# Patient Record
Sex: Female | Born: 1975 | Race: Black or African American | Hispanic: No | Marital: Married | Smoking: Former smoker
Health system: Southern US, Community
[De-identification: ages and names within clinical notes are randomized; demographics above are authoritative.]

## PROBLEM LIST (undated history)

## (undated) DIAGNOSIS — K219 Gastro-esophageal reflux disease without esophagitis: Secondary | ICD-10-CM

## (undated) HISTORY — PX: ECTOPIC PREGNANCY SURGERY: SHX613

## (undated) HISTORY — PX: ESOPHAGEAL DILATION: SHX303

## (undated) HISTORY — DX: Gastro-esophageal reflux disease without esophagitis: K21.9

---

## 2005-11-27 ENCOUNTER — Emergency Department (HOSPITAL_COMMUNITY): Admission: EM | Admit: 2005-11-27 | Discharge: 2005-11-27 | Payer: Self-pay | Admitting: Emergency Medicine

## 2007-10-10 ENCOUNTER — Emergency Department (HOSPITAL_COMMUNITY): Admission: EM | Admit: 2007-10-10 | Discharge: 2007-10-10 | Payer: Self-pay | Admitting: Emergency Medicine

## 2009-02-25 ENCOUNTER — Emergency Department (HOSPITAL_COMMUNITY): Admission: EM | Admit: 2009-02-25 | Discharge: 2009-02-26 | Payer: Self-pay | Admitting: Emergency Medicine

## 2009-07-28 ENCOUNTER — Emergency Department (HOSPITAL_COMMUNITY): Admission: EM | Admit: 2009-07-28 | Discharge: 2009-07-28 | Payer: Self-pay | Admitting: Emergency Medicine

## 2010-09-03 ENCOUNTER — Emergency Department: Payer: Self-pay | Admitting: Emergency Medicine

## 2010-11-03 ENCOUNTER — Ambulatory Visit: Payer: Self-pay | Admitting: Gastroenterology

## 2010-11-03 DIAGNOSIS — R1310 Dysphagia, unspecified: Secondary | ICD-10-CM | POA: Insufficient documentation

## 2010-11-04 ENCOUNTER — Telehealth: Payer: Self-pay | Admitting: Gastroenterology

## 2010-11-06 ENCOUNTER — Ambulatory Visit: Payer: Self-pay | Admitting: Gastroenterology

## 2010-11-07 ENCOUNTER — Telehealth: Payer: Self-pay | Admitting: Gastroenterology

## 2011-01-01 ENCOUNTER — Ambulatory Visit: Admit: 2011-01-01 | Payer: Self-pay | Admitting: Gastroenterology

## 2011-01-27 NOTE — Progress Notes (Signed)
Summary: Her insurance will not cover meds  Phone Note Call from Patient Call back at Home Phone [phone]   Call For: Dr Arlyce Dice Summary of Call: Her insurance will not cover the medication that was ordered for her.  Initial call taken by: Leanor Kail [facility],  November 07, 2010 1:12 PM  Follow-up for Phone Call        Tried to contact pt, No Answer.  Follow-up by: Merri Ray CMA [facility]),  November 10, 2010 9:28 AM  Additional Follow-up for Phone Call Additional follow up Details #1::        Insurance (Medicaid)Approved Protonix for 1 year. Tried to contact pt no answer. Additional Follow-up by: Merri Ray CMA [facility]),  November 10, 2010 10:07 AM

## 2011-01-27 NOTE — Letter (Signed)
Summary: Results Letter  [facility]  [address] [place], [place] [address]   Phone: [phone]  Fax: [phone]        November 03, 2010 MRN: [id number]    Crystal Salazar [address] [place], [place]  [address]    Dear Ms. [facility],  It is my pleasure to have treated you recently as a new patient in my office. I appreciate your confidence and the opportunity to participate in your care.  Since I do have a busy inpatient endoscopy schedule and office schedule, my office hours vary weekly. I am, however, available for emergency calls everyday through my office. If I am not available for an urgent office appointment, another one of our gastroenterologist will be able to assist you.  My well-trained staff are prepared to help you at all times. For emergencies after office hours, a physician from our Gastroenterology section is always available through my 24 hour answering service  Once again I welcome you as a new patient and I look forward to a happy and healthy relationship             Sincerely,  Louis Meckel MD  This letter has been electronically signed by your physician.  Appended Document: Results Letter letter mailed

## 2011-01-27 NOTE — Assessment & Plan Note (Signed)
Summary: GERD/YF   History of Present Illness Visit Type: consult Primary GI MD: Melvia Heaps MD [facility] Primary Provider: Clyda Greener, MD Requesting Provider: Clyda Greener, MD Chief Complaint: solid food dysphagia History of Present Illness:   Ms. Crystal Salazar is a pleasant 35 year old female referred at the request of Dr. Bruna Potter for complaints of dysphagia.  She complains of dysphagia to solids.  She takes omeprazole twice a day.  Weight has been stable.  She may develop mild chest discomfort with her dysphagia.   GI Review of Systems    Reports chest pain and  dysphagia with solids.        Preventive Screening-Counseling & Management  Alcohol-Tobacco     Smoking Status: current      Drug Use:  no.      Current Medications (verified): 1)  Omeprazole 20 Mg Cpdr (Omeprazole) .... Take 1 Capsule By Mouth Two Times A Day 2)  Mirena 20 Mcg/24hr Iud (Levonorgestrel) .... As Directed 3)  Aleve 220 Mg Tabs (Naproxen Sodium) .... As Needed For Headache  Allergies (verified): 1)  Sulfa  Past History:  Past Medical History: GERD  Past Surgical History: none  Family History: Family History of Colon Cancer: PGF-deceased 45's Family History of Diabetes: Father Family History of Heart Disease: MGM-deceased MI, Mother Family History of Leukemia: PGM  Social History: Married, 2 boys, 2 girls Patient currently smokes.  Alcohol Use - no Daily Caffeine Use 3/day Illicit Drug Use - no Smoking Status:  current Drug Use:  no  Review of Systems       The patient complains of back pain, menstrual pain, and urine leakage.  The patient denies allergy/sinus, anemia, anxiety-new, arthritis/joint pain, blood in urine, breast changes/lumps, confusion, cough, coughing up blood, depression-new, fainting, fatigue, fever, headaches-new, hearing problems, heart murmur, heart rhythm changes, itching, muscle pains/cramps, night sweats, nosebleeds, pregnancy symptoms, shortness of breath, skin  rash, sleeping problems, sore throat, swelling of feet/legs, swollen lymph glands, thirst - excessive, urination - excessive, urination changes/pain, vision changes, and voice change.         All other systems were reviewed and were negative   Vital Signs:  Patient profile:   35 year old female Height:      62 inches Weight:      196 pounds BMI:     35.98 Pulse rate:   64 / minute Pulse rhythm:   regular BP sitting:   108 / 66  (left arm) Cuff size:   regular  Vitals Entered By: Francee Piccolo CMA [facility]) (November 03, 2010 8:27 AM)  Physical Exam  Additional Exam:  On physical exam she is well-developed well-nourished female  skin: anicteric HEENT: normocephalic; PEERLA; no nasal or pharyngeal abnormalities neck: supple nodes: no cervical lymphadenopathy chest: clear to ausculatation and percussion heart: no murmurs, gallops, or rubs abd: soft, nontender; BS normoactive; no abdominal masses, tenderness, organomegaly rectal: deferred ext: no cynanosis, clubbing, edema skeletal: no deformities neuro: oriented x 3; no focal abnormalities    Impression & Recommendations:  Problem # 1:  DYSPHAGIA UNSPECIFIED (ICD-787.20) Assessment New  This is most likely due to an esophageal stricture.  Eosinophilic esophagitis is another consideration.  Recommendations #1 trial of DEXILANT 60 mg daily #2 upper endoscopy with dilatation as indicated  Risks, alternatives, and complications of the procedure, including bleeding, perforation, and possible need for surgery, were explained to the patient.  Patient's questions were answered.  Orders: EGD (EGD)  Patient Instructions: 1)  Copy sent to : Britt Bottom  Blount, MD 2)  Your EGD is scheduled on 11/06/2010 at 3:30pm 3)  We are giving you Dexilant samples today 4)  Conscious Sedation brochure given.  5)  Upper Endoscopy with Dilatation brochure given.  6)  The medication list was reviewed and reconciled.  All changed / newly  prescribed medications were explained.  A complete medication list was provided to the patient / caregiver. Prescriptions: DEXILANT 60 MG CPDR (DEXLANSOPRAZOLE) take 1 tab before breakfast once daily  #30 x 1   Entered and Authorized by:   Louis Meckel MD   Signed by:   Louis Meckel MD on 11/04/2010   Method used:   [facility] to        [facility]  [address] (retail)       [address]       [place], [place]  [address]       Ph: [phone] or [phone]       Fax: [phone]   RxID:   [id number]

## 2011-01-27 NOTE — Miscellaneous (Signed)
  Clinical Lists Changes  Medications: Removed medication of DEXILANT 60 MG CPDR (DEXLANSOPRAZOLE) take 1 tab before breakfast once daily Removed medication of OMEPRAZOLE 20 MG CPDR (OMEPRAZOLE) Take 1 capsule by mouth two times a day Added new medication of PROTONIX 40 MG  TBEC (PANTOPRAZOLE SODIUM) 1 each day 30 minutes before meal - Signed Rx of PROTONIX 40 MG  TBEC (PANTOPRAZOLE SODIUM) 1 each day 30 minutes before meal;  #30 x 2;  Signed;  Entered by: Louis Meckel MD;  Authorized by: Louis Meckel MD;  Method used: [facility]  [address], [place], [place]  [address], Ph: [phone] or [phone], Fax: [phone]    Prescriptions: PROTONIX 40 MG  TBEC (PANTOPRAZOLE SODIUM) 1 each day 30 minutes before meal  #30 x 2   Entered and Authorized by:   Louis Meckel MD   Signed by:   Louis Meckel MD on 11/06/2010   Method used:   [facility]        [facility]  [address] (retail)       [address]       [place], [place]  [address]       Ph: [phone] or [phone]       Fax: [phone]   RxID:   [id number]

## 2011-01-27 NOTE — Letter (Signed)
Summary: EGD Instructions  [facility]  [address] [place], [place] [address]   Phone: [phone]  Fax: [phone]       Crystal Salazar    [birth year]    MRN: [id number]       Procedure Day /Date:THURSDAY 11/06/2010     Arrival Time: 2:30PM     Procedure Time:3:30PM     Location of Procedure:                    X  [facility] (4th Floor)   PREPARATION FOR ENDOSCOPY/DIL   On11/09/2010 THE DAY OF THE PROCEDURE:  1.   No solid foods, milk or milk products are allowed after midnight the night before your procedure.  2.   Do not drink anything colored red or purple.  Avoid juices with pulp.  No orange juice.  3.  You may drink clear liquids until1:30PM, which is 2 hours before your procedure.                                                                                                CLEAR LIQUIDS INCLUDE: Water Jello Ice Popsicles Tea (sugar ok, no milk/cream) Powdered fruit flavored drinks Coffee (sugar ok, no milk/cream) Gatorade Juice: apple, white grape, white cranberry  Lemonade Clear bullion, consomm, broth Carbonated beverages (any kind) Strained chicken noodle soup Hard Candy   MEDICATION INSTRUCTIONS  Unless otherwise instructed, you should take regular prescription medications with a small sip of water as early as possible the morning of your procedure.          OTHER INSTRUCTIONS  You will need a responsible adult at least 37 years of age to accompany you and drive you home.   This person must remain in the waiting room during your procedure.  Wear loose fitting clothing that is easily removed.  Leave jewelry and other valuables at home.  However, you may wish to bring a book to read or an iPod/MP3 player to listen to music as you wait for your procedure to start.  Remove all body piercing jewelry and leave at home.  Total time from sign-in until discharge is approximately 2-3 hours.  You should go home  directly after your procedure and rest.  You can resume normal activities the day after your procedure.  The day of your procedure you should not:   Drive   Make legal decisions   Operate machinery   Drink alcohol   Return to work  You will receive specific instructions about eating, activities and medications before you leave.    The above instructions have been reviewed and explained to me by   _______________________    I fully understand and can verbalize these instructions _____________________________ Date _________

## 2011-01-27 NOTE — Progress Notes (Signed)
Summary: Dexilant denied by [facility]  Phone Note Outgoing Call Call back at [facility] Phone [phone]   Call placed by: Merri Ray CMA [facility]),  November 04, 2010 1:25 PM Summary of Call: Called pt to inform that Dexilant not covered by Medicaid. L/M for pt to return my call Initial call taken by: Merri Ray CMA [facility]),  November 04, 2010 1:25 PM  Follow-up for Phone Call        Pt is having procedure today, she will discuss with Dr Arlyce Dice what she can be prescribed besides the Dexilant that the insurance will cover. Follow-up by: Merri Ray CMA [facility]),  November 06, 2010 2:24 PM

## 2011-01-27 NOTE — Procedures (Signed)
Summary: Upper Endoscopy  Patient: Crystal Salazar Note: All result statuses are Final unless otherwise noted.  Tests: (1) Upper Endoscopy (EGD)   EGD Upper Endoscopy       DONE     [facility]     [address]     [place], [place]  [address]           ENDOSCOPY PROCEDURE REPORT           PATIENT:  Crystal Salazar, Crystal Salazar  MR#:  [id number]     BIRTHDATE:  Jan 03, 1976, 34 yrs. old  GENDER:  female           ENDOSCOPIST:  Barbette Hair. Arlyce Dice, MD     Referred by:           PROCEDURE DATE:  11/06/2010     PROCEDURE:  EGD, diagnostic 43235, Maloney Dilation of Esophagus     ASA CLASS:  Class I     INDICATIONS:  dysphagia           MEDICATIONS:   Fentanyl 50 mcg IV, Versed 6 mg IV, glycopyrrolate     (Robinal) 0.2 mg IV, 0.6cc simethancone 0.6 cc PO     TOPICAL ANESTHETIC:  Exactacain Spray           DESCRIPTION OF PROCEDURE:   After the risks benefits and     alternatives of the procedure were thoroughly explained, informed     consent was obtained.  [facility] GIF-[id number] [id number] endoscope was     introduced through the mouth and advanced to the third portion of     the duodenum, without limitations.  The instrument was slowly     withdrawn as the mucosa was fully examined.     <<PROCEDUREIMAGES>>           A stricture was found at the gastroesophageal junction (see     image2). Early stricture Dilation with maloney dilator 18mm Mild     resistance; no heme  Otherwise the examination was normal.     Retroflexed views revealed no abnormalities.    The scope was then     withdrawn from the patient and the procedure completed.           COMPLICATIONS:  None           ENDOSCOPIC IMPRESSION:     1) Stricture at the gastroesophageal junction - s/p maloney     dilitation     2) Otherwise normal examination     RECOMMENDATIONS:     1) Protonix 40 mg qd     2) Call office next 2-3 days to schedule an office appointment     for 6 weeks     3) repeat dilatation as needed           REPEAT  EXAM:  No           ______________________________     Barbette Hair. Arlyce Dice, MD           CC:  Crystal Greener, MD           n.     Rosalie Doctor:   Barbette Hair. Kaplan at 11/06/2010 03:52 PM           Crystal Salazar, Riccobono Salazar, [id number]  Note: An exclamation mark (!) indicates a result that was not dispersed into the flowsheet. Document Creation Date: 11/06/2010 3:52 PM _______________________________________________________________________  (1) Order result status: Final Collection or observation date-time: 11/06/2010 15:45 Requested date-time:  Receipt date-time:  Reported date-time:  Referring Physician:   Ordering Physician: Melvia Heaps [phone]) Specimen Source:  Source: Launa Grill Order Number: [id number] Lab site:

## 2011-02-04 ENCOUNTER — Ambulatory Visit (INDEPENDENT_AMBULATORY_CARE_PROVIDER_SITE_OTHER): Payer: Medicaid Other | Admitting: Gastroenterology

## 2011-02-04 ENCOUNTER — Encounter: Payer: Self-pay | Admitting: Gastroenterology

## 2011-02-04 DIAGNOSIS — K222 Esophageal obstruction: Secondary | ICD-10-CM

## 2011-02-12 NOTE — Assessment & Plan Note (Signed)
Summary: F/U Procedure   History of Present Illness Visit Type: Follow-up Visit Primary GI MD: Melvia Heaps MD [facility] Primary Provider: Clyda Greener, MD Requesting Provider: N/A Chief Complaint: Post procedure, patient not having any problems History of Present Illness:   Ms. Posa has returned for followup of her esophageal stricture and reflux.  In November, 2011 she was dilated to 18 mm.  Since that time she has had no recurrent GI complaints including dysphagia or pyrosis.  She remains on daily Protonix.   GI Review of Systems      Denies abdominal pain, acid reflux, belching, bloating, chest pain, dysphagia with liquids, dysphagia with solids, heartburn, loss of appetite, nausea, vomiting, vomiting blood, weight loss, and  weight gain.        Denies anal fissure, black tarry stools, change in bowel habit, constipation, diarrhea, diverticulosis, fecal incontinence, heme positive stool, hemorrhoids, irritable bowel syndrome, jaundice, light color stool, liver problems, rectal bleeding, and  rectal pain.    Current Medications (verified): 1)  Mirena 20 Mcg/24hr Iud (Levonorgestrel) .... As Directed 2)  Aleve 220 Mg Tabs (Naproxen Sodium) .... As Needed For Headache 3)  Protonix 40 Mg  Tbec (Pantoprazole Sodium) .Marland Kitchen.. 1 Each Day 30 Minutes Before Meal  Allergies (verified): 1)  Sulfa  Past History:  Past Medical History: Reviewed history from 11/03/2010 and no changes required. GERD  Past Surgical History: Reviewed history from 11/03/2010 and no changes required. none  Family History: Reviewed history from 11/03/2010 and no changes required. Family History of Colon Cancer: PGF-deceased 18's Family History of Diabetes: Father Family History of Heart Disease: MGM-deceased MI, Mother Family History of Leukemia: PGM  Social History: Reviewed history from 11/03/2010 and no changes required. Married, 2 boys, 2 girls Patient currently smokes.  Alcohol Use - no Daily  Caffeine Use 3/day Illicit Drug Use - no  Review of Systems  The patient denies allergy/sinus, anemia, anxiety-new, arthritis/joint pain, back pain, blood in urine, breast changes/lumps, change in vision, confusion, cough, coughing up blood, depression-new, fainting, fatigue, fever, headaches-new, hearing problems, heart murmur, heart rhythm changes, itching, menstrual pain, muscle pains/cramps, night sweats, nosebleeds, pregnancy symptoms, shortness of breath, skin rash, sleeping problems, sore throat, swelling of feet/legs, swollen lymph glands, thirst - excessive , urination - excessive , urination changes/pain, urine leakage, vision changes, and voice change.    Vital Signs:  Patient profile:   35 year old female Height:      62 inches Weight:      195 pounds BMI:     35.79 Pulse rate:   64 / minute Pulse rhythm:   regular BP sitting:   96 / 66  (left arm) Cuff size:   regular  Vitals Entered By: June McMurray CMA [facility] Dull) (February 04, 2011 1:48 PM)   Impression & Recommendations:  Problem # 1:  DYSPHAGIA UNSPECIFIED (ICD-787.20) Assessment Improved Plan repeat dilatation p.r.n.  She will try to wean from Protonix.  Patient Instructions: 1)  Copy sent to : Clyda Greener, MD 2)  Follow up as needed  3)  The medication list was reviewed and reconciled.  All changed / newly prescribed medications were explained.  A complete medication list was provided to the patient / caregiver.

## 2011-04-04 LAB — URINALYSIS, ROUTINE W REFLEX MICROSCOPIC
Bilirubin Urine: NEGATIVE
Ketones, ur: NEGATIVE mg/dL
Specific Gravity, Urine: 1.018 (ref 1.005–1.030)
pH: 7 (ref 5.0–8.0)

## 2011-04-04 LAB — URINE MICROSCOPIC-ADD ON

## 2011-04-09 LAB — URINALYSIS, ROUTINE W REFLEX MICROSCOPIC
Leukocytes, UA: NEGATIVE
Nitrite: NEGATIVE
Protein, ur: 30 mg/dL — AB
Urobilinogen, UA: 1 mg/dL (ref 0.0–1.0)

## 2011-04-09 LAB — URINE MICROSCOPIC-ADD ON

## 2011-04-09 LAB — POCT PREGNANCY, URINE: Preg Test, Ur: NEGATIVE

## 2011-09-04 ENCOUNTER — Emergency Department: Payer: Self-pay

## 2011-10-08 LAB — POCT PREGNANCY, URINE
Operator id: 146091
Preg Test, Ur: POSITIVE

## 2011-10-24 ENCOUNTER — Emergency Department: Payer: Self-pay | Admitting: Internal Medicine

## 2014-03-17 ENCOUNTER — Encounter: Payer: Self-pay | Admitting: *Deleted

## 2015-04-08 ENCOUNTER — Ambulatory Visit: Payer: Medicaid Other | Attending: Orthopaedic Surgery | Admitting: Physical Therapy

## 2015-04-08 ENCOUNTER — Encounter: Payer: Self-pay | Admitting: Physical Therapy

## 2015-04-08 DIAGNOSIS — M5442 Lumbago with sciatica, left side: Secondary | ICD-10-CM | POA: Insufficient documentation

## 2015-04-08 NOTE — Therapy (Signed)
[facility] [address] [place], [place], [address] Phone: [phone]   Fax:  [phone]  Physical Therapy Evaluation  Patient Details  Name: Crystal Salazar MRN: [id number] Date of Birth: 10/30/1976 Referring Provider:  Tarry Kos, MD  Encounter Date: 04/08/2015      PT End of Session - 04/08/15 0948    Visit Number 1   Date for PT Re-Evaluation 06/08/15   PT Start Time 0908   PT Stop Time 1015   PT Time Calculation (min) 67 min      History reviewed. No pertinent past medical history.  History reviewed. No pertinent past surgical history.  There were no vitals filed for this visit.  Visit Diagnosis:  Midline low back pain with left-sided sciatica - Plan: PT plan of care cert/re-cert      Subjective Assessment - 04/08/15 0922    Subjective C/O LBP since epidural in 1997.  She reports that the pain is worse recently.  She works as a Lawyer.   Pertinent History none   Limitations Sitting;Lifting;Standing;Walking;House hold activities   How long can you sit comfortably? 25   How long can you stand comfortably? 10   How long can you walk comfortably? 15   Diagnostic tests x-rays negative   Patient Stated Goals no pain   Currently in Pain? Yes   Pain Score 8    Pain Location Back   Pain Orientation Lower;Left   Pain Descriptors / Indicators Aching;Shooting   Pain Radiating Towards at times down the left posterior leg   Pain Onset More than a month ago   Aggravating Factors  lifting and bending up to 9/10   Pain Relieving Factors rest   Effect of Pain on Daily Activities limits all activities   Multiple Pain Sites Yes   Pain Location Wrist            [facility] PT Assessment - 04/08/15 0001    Assessment   Medical Diagnosis LBP   Onset Date 04/07/14   Prior Therapy none   Precautions   Precautions None   Balance Screen   Has the patient fallen in the past 6 months No   Has the patient had a  decrease in activity level because of a fear of falling?  No   Is the patient reluctant to leave their home because of a fear of falling?  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   Leisure none   Posture/Postural Control   Posture Comments fwd head, rounded shoulders   AROM   Overall AROM Comments Lumbar ROM was decreased 25% with some c/o popping and tightness   Flexibility   Soft Tissue Assessment /Muscle Length --  She is tight in the HS, ITB and piriformis mms   Palpation   Palpation she is very tight in the lumbar paraspinals and into the thoracic area   Special Tests    Special Tests Lumbar   Slump test   Findings Positive   Side Left   Comment bilateral positive   Straight Leg Raise   Findings Positive   Comment bilateral  [facility] Adult PT Treatment/Exercise - 04/08/15 0001    Modalities   Modalities Electrical Stimulation;Moist Heat   Moist Heat Therapy   Number Minutes Moist Heat 15 Minutes   Moist Heat Location --  low back   Electrical Stimulation   Electrical Stimulation Location low back   Electrical Stimulation Parameters IFC   Electrical Stimulation Goals Pain                PT Education - 04/08/15 0946    Education provided Yes   Education Details Wms Flexion for lumbar flexibility   Person(s) Educated Patient   Methods Explanation;Demonstration;Handout   Comprehension Verbalized understanding          PT Short Term Goals - 04/08/15 0951    PT SHORT TERM GOAL #1   Title independent with initial HEP   Time 2   Period Weeks   Status New           PT Long Term Goals - 04/08/15 16100952    PT LONG TERM GOAL #1   Title understand proper posture and body mechanics   Time 2   Period  Weeks   Status New               Plan - 04/08/15 0949    Pt will benefit from skilled therapeutic intervention in order to improve on the following deficits Decreased range of motion;Decreased strength;Increased muscle spasms;Impaired flexibility;Pain;Improper body mechanics   Rehab Potential Good   PT Frequency 2x / week   PT Duration 8 weeks   PT Treatment/Interventions Electrical Stimulation;ADLs/Self Care Home Management;Moist Heat;Ultrasound;Traction;Therapeutic activities;Therapeutic exercise;Manual techniques;Patient/family education   PT Next Visit Plan Add gym exercises   Consulted and Agree with Plan of Care Patient      Medicaid only allows 1 visit per year.  We will D/C   Problem List Patient Active Problem List   Diagnosis Date Noted  . DYSPHAGIA UNSPECIFIED 11/03/2010    Jearld LeschALBRIGHT,Zaevion Parke W, PT 04/08/2015, 10:14 AM  [facility] [address] [facility] [address] [address] [place], [place], [address] Phone: [phone]   Fax:  [phone]

## 2015-06-03 ENCOUNTER — Other Ambulatory Visit: Payer: Self-pay | Admitting: Orthopaedic Surgery

## 2015-06-03 DIAGNOSIS — M545 Low back pain: Secondary | ICD-10-CM

## 2015-06-08 ENCOUNTER — Other Ambulatory Visit: Payer: Medicaid Other

## 2015-06-09 ENCOUNTER — Ambulatory Visit
Admission: RE | Admit: 2015-06-09 | Discharge: 2015-06-09 | Disposition: A | Discharge status: Home / Self Care | Payer: Medicaid Other | Source: Ambulatory Visit | Attending: Orthopaedic Surgery | Admitting: Orthopaedic Surgery

## 2015-06-09 DIAGNOSIS — M545 Low back pain: Secondary | ICD-10-CM

## 2015-06-21 ENCOUNTER — Encounter: Payer: Self-pay | Admitting: Gastroenterology

## 2016-02-04 ENCOUNTER — Ambulatory Visit (INDEPENDENT_AMBULATORY_CARE_PROVIDER_SITE_OTHER): Payer: Medicaid Other | Admitting: Internal Medicine

## 2016-02-04 ENCOUNTER — Encounter: Payer: Self-pay | Admitting: Internal Medicine

## 2016-02-04 VITALS — BP 116/47 | HR 59 | Temp 98.1°F | Wt 201.0 lb

## 2016-02-04 DIAGNOSIS — R1310 Dysphagia, unspecified: Secondary | ICD-10-CM | POA: Diagnosis not present

## 2016-02-04 DIAGNOSIS — Z0000 Encounter for general adult medical examination without abnormal findings: Secondary | ICD-10-CM | POA: Insufficient documentation

## 2016-02-04 DIAGNOSIS — N39498 Other specified urinary incontinence: Secondary | ICD-10-CM

## 2016-02-04 DIAGNOSIS — N3946 Mixed incontinence: Secondary | ICD-10-CM

## 2016-02-04 DIAGNOSIS — R32 Unspecified urinary incontinence: Secondary | ICD-10-CM | POA: Diagnosis not present

## 2016-02-04 NOTE — Patient Instructions (Signed)
We will be sending you to a bladder doctor, to find out why you are having problems with urination.   It is very important you do kegel exercises to help you hold urine and strengthen the muscles in your pelvis.   We will get records from your PCP to find out when you will be due for your next test.  Kegel Exercises The goal of Kegel exercises is to isolate and exercise your pelvic floor muscles. These muscles act as a hammock that supports the rectum, vagina, small intestine, and uterus. As the muscles weaken, the hammock sags and these organs are displaced from their normal positions. Kegel exercises can strengthen your pelvic floor muscles and help you to improve bladder and bowel control, improve sexual response, and help reduce many problems and some discomfort during pregnancy. Kegel exercises can be done anywhere and at any time. HOW TO PERFORM KEGEL EXERCISES 1. Locate your pelvic floor muscles. To do this, squeeze (contract) the muscles that you use when you try to stop the flow of urine. You will feel a tightness in the vaginal area (women) and a tight lift in the rectal area (men and women). 2. When you begin, contract your pelvic muscles tight for 2-5 seconds, then relax them for 2-5 seconds. This is one set. Do 4-5 sets with a short pause in between. 3. Contract your pelvic muscles for 8-10 seconds, then relax them for 8-10 seconds. Do 4-5 sets. If you cannot contract your pelvic muscles for 8-10 seconds, try 5-7 seconds and work your way up to 8-10 seconds. Your goal is 4-5 sets of 10 contractions each day. Keep your stomach, buttocks, and legs relaxed during the exercises. Perform sets of both short and long contractions. Vary your positions. Perform these contractions 3-4 times per day. Perform sets while you are:   Lying in bed in the morning.  Standing at lunch.  Sitting in the late afternoon.  Lying in bed at night. You should do 40-50 contractions per day. Do not perform  more Kegel exercises per day than recommended. Overexercising can cause muscle fatigue. Continue these exercises for for at least 15-20 weeks or as directed by your caregiver.   This information is not intended to replace advice given to you by your health care provider. Make sure you discuss any questions you have with your health care provider.

## 2016-02-04 NOTE — Assessment & Plan Note (Signed)
Pt says she used to have difficulty swallowing, she was told her oesophagus was narrow so she had dilatation in ~2015, and has felt fine since then.

## 2016-02-04 NOTE — Assessment & Plan Note (Signed)
Chronic issue for pt, ~2-3 years. Incontinence when she laughs and even without any activity. 4 vaiginal deliveries, with 1 vaccum assisted. Impression- Likely mixed urinary incontinence with Stress and possibly over active bladder.  Plan- She will start with kegel exercises. - Will refer to urology for possible bladder emptying study. - UA

## 2016-02-04 NOTE — Progress Notes (Signed)
Patient ID: Crystal Salazar, female   DOB: Aug 29, 1976, 40 y.o.   MRN: [id number]   Subjective:   Patient ID: Crystal Salazar female   DOB: 1976-11-25 40 y.o.   MRN: [id number]  HPI: Ms.Kerigan Anfinson is a 40 y.o. with no PMH presented today to establish care with [place]. pts PCP passed away 02/17/2023 . She says she has problems with her baldder, urine leaks out and wet her panties, and this is worse when she laughs, coughs. This has been going on for ~2-3 years. No burning with urination, she he does not wake up at night to pass. She has had 4 vaginal deliveries and one vacuum assisted delievery. She says he had a UTI- last week, she was given an antibiotic for it in the ED, ?name. She also says he has been having increased secretions from her vagina, she has ben having for 3-4 months, no abnormal smell, not cheesy like, but whitish and watery. No itching. She also says she has lower abdominal cramps- bilaterally for about 1 year. A new IUD was placed about a year ago.  No fever.  Former smoker- quit ~10 yrs ago,  Alcohol- occasionally Drugs- illicit- never.  No past medical history on file. Current Outpatient Prescriptions  Medication Sig Dispense Refill  . levonorgestrel (MIRENA) 20 MCG/24HR IUD 1 each by Intrauterine route once.    Marland Kitchen omeprazole (PRILOSEC) 20 MG capsule Take 20 mg by mouth daily.     No current facility-administered medications for this visit.   Family History  Problem Relation Age of Onset  . Hypertension Mother   . Diabetes Mellitus I Father    Social History   Social History  . Marital Status: Married    Spouse Name: N/A  . Number of Children: N/A  . Years of Education: N/A   Social History Main Topics  . Smoking status: Current Every Day Smoker  . Smokeless tobacco: Not on file  . Alcohol Use: No  . Drug Use: No  . Sexual Activity: Not on file   Other Topics Concern  . Not on file   Social History Narrative   Review of Systems: CONSTITUTIONAL- No Fever,  weightloss, or change in appetite. SKIN- No Rash, colour changes or itching. HEAD- No Headache or dizziness. Mouth/throat- No Sorethroat, dentures, or bleeding gums. RESPIRATORY- No Cough or SOB. CARDIAC- No Palpitations, or chest pain. GI- No, vomiting, diarrhoea, has some intermittent crampy abdominal pain NEUROLOGIC- No Numbness,, seizures or burning. [facility]- Denies depression or anxiety.  Objective:  Physical Exam: Filed Vitals:   02/04/16 0924  BP: 116/47  Pulse: 59  Temp: 98.1 F (36.7 C)  TempSrc: Oral  Weight: 201 lb (91.173 kg)  SpO2: 100%   GENERAL- alert, co-operative, appears as stated age, not in any distress. HEENT- Atraumatic, normocephalic, PERRL, EOMI,  neck supple. CARDIAC- RRR, no murmurs, rubs or gallops. RESP- clear to auscultation bilaterally, no wheezes or crackles. ABDOMEN- Soft, nontender, bowel sounds present. BACK- Normal curvature of the spine, no CVA tenderness. NEURO- No obvious Cr N abnormality, gait- Normal. EXTREMITIES- pulse 2+, symmetric, no pedal edema. SKIN- Warm, dry, No rash or lesion. PSYCH- Normal mood and affect, appropriate thought content and speech.  Assessment & Plan:   The patient's case and plan of care was discussed with attending physician, Dr. Criselda Peaches.  Please see problem based charting for assessment and plan.

## 2016-02-04 NOTE — Assessment & Plan Note (Signed)
She gets pap smears, but she is not sure when her last one was- August 2014. We will obtain records. No family hx of breast cancer, colon cancer in paternal grandfather- at an older age- ? Exact age.

## 2016-02-05 LAB — URINALYSIS, ROUTINE W REFLEX MICROSCOPIC
Bilirubin, UA: NEGATIVE
Glucose, UA: NEGATIVE
Ketones, UA: NEGATIVE
LEUKOCYTES UA: NEGATIVE
NITRITE UA: NEGATIVE
Protein, UA: NEGATIVE
Specific Gravity, UA: 1.022 (ref 1.005–1.030)
Urobilinogen, Ur: 0.2 mg/dL (ref 0.2–1.0)
pH, UA: 5.5 (ref 5.0–7.5)

## 2016-02-05 LAB — MICROSCOPIC EXAMINATION
BACTERIA UA: NONE SEEN
Casts: NONE SEEN /lpf

## 2016-02-10 NOTE — Addendum Note (Signed)
Addended by: Debe Coder B on: 02/10/2016 11:22 AM   Modules accepted: Level of Service

## 2016-02-10 NOTE — Progress Notes (Signed)
Internal Medicine Clinic Attending  Case discussed with Dr. Emokpae at the time of the visit.  We reviewed the resident's history and exam and pertinent patient test results.  I agree with the assessment, diagnosis, and plan of care documented in the resident's note.  

## 2016-03-03 ENCOUNTER — Ambulatory Visit (INDEPENDENT_AMBULATORY_CARE_PROVIDER_SITE_OTHER): Payer: Medicaid Other | Admitting: Internal Medicine

## 2016-03-03 ENCOUNTER — Ambulatory Visit: Payer: Medicaid Other | Admitting: Internal Medicine

## 2016-03-03 ENCOUNTER — Encounter: Payer: Self-pay | Admitting: Internal Medicine

## 2016-03-03 VITALS — BP 109/78 | HR 75 | Temp 98.1°F | Wt 201.9 lb

## 2016-03-03 DIAGNOSIS — R1031 Right lower quadrant pain: Secondary | ICD-10-CM | POA: Diagnosis not present

## 2016-03-03 DIAGNOSIS — M7661 Achilles tendinitis, right leg: Secondary | ICD-10-CM

## 2016-03-03 DIAGNOSIS — R32 Unspecified urinary incontinence: Secondary | ICD-10-CM

## 2016-03-03 DIAGNOSIS — N393 Stress incontinence (female) (male): Secondary | ICD-10-CM | POA: Insufficient documentation

## 2016-03-03 DIAGNOSIS — N940 Mittelschmerz: Secondary | ICD-10-CM

## 2016-03-03 NOTE — Assessment & Plan Note (Addendum)
She complains of urinary incontinence while coughing, and has had 4 vaginal deliveries, so I think this is most likely stress incontinence. She has tried Kegel exercises for the past month without much luck. I've referred her to gynecology today for consideration of pessary placement.

## 2016-03-03 NOTE — Progress Notes (Signed)
Patient ID: Crystal Salazar, female   DOB: 04/23/1976, 40 y.o.   MRN: [id number] [facility] Subjective:   Patient ID: Crystal Salazar female   DOB: 11/18/1976 40 y.o.   MRN: [id number]  HPI: Crystal Salazar is a 40 y.o. female with stress incontinence and gastroesophageal reflux disease presenting for follow-up for stress incontinence, and complaints of right achilles pain and intermittent right lower quadrant abdominal pain.  Stress incontinence: She has urinary incontinence while coughing and has tried Kegel exercises for the last month without much success. She's had four children via vaginal deliveries.   Right ankle pain: She has achilles pain at the insertion to the calcaneus that is worse with walking and wearing heels.   Intermittent right lower quadrant pain: About once a month, she has a stabbing right lower quadrant abdominal pain that resolves after a few seconds. She denies any parethesias over the anterior upper leg or inguinal bulges.  I've reviewed her medications with her and she is not smoking.  Review of Systems  Constitutional: Negative for fever, chills and malaise/fatigue.  Respiratory: Negative for shortness of breath.   Cardiovascular: Negative for chest pain and leg swelling.  Gastrointestinal: Positive for abdominal pain. Negative for diarrhea, constipation and blood in stool.  Genitourinary: Negative for dysuria, urgency and frequency.  Musculoskeletal: Positive for joint pain.   Objective:  Physical Exam: Filed Vitals:   03/03/16 1034  BP: 109/78  Pulse: 75  Temp: 98.1 F (36.7 C)  TempSrc: Oral  Weight: 201 lb 14.4 oz (91.581 kg)  SpO2: 100%   General: resting in chair comfortably, appropriately conversational Cardiac: regular rate and rhythm, no rubs, murmurs or gallops Pulm: breathing well, clear to auscultation bilaterally Abd: bowel sounds normal, soft, no inguinal bulge Ext: tender to palpation over right achilles  insertion site with good range of motion Lymph: no cervical or supraclavicular lymphadenopathy Skin: no rash, hair, or nail changes   Assessment & Plan:  Case discussed with Dr. Oswaldo DoneVincent  Achilles tendinitis of right lower extremity Her Achilles tendinitis of the right ankle is probably from overuse and wearing high heels. I recommended she avoid wearing high heels, I printed off some exercises for her, and recommended ibuprofen if she needs to walk for prolonged period. If she continues to have tendinitis, she would like to be referred to physical therapy, but would not like to do it today because of the cost.  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 There is no inguinal bulge suggestive of a hernia, nor any paresthesias in the lateral femoral nerve distribution suggestive of meralgia paresthetica. She is on a Mirena IUD, which does not inhibit ovulation. She had been on birth control pills, but had forgot to take these, and became pregnant 7 years ago. I think switching the Mirena IUD to Implanon may be a good option, and she was interested in this. I've referred her to gynecology to continue the discussion.  Stress incontinence She complains of urinary incontinence while coughing, and has had 4 vaginal deliveries, so I think this is most likely stress incontinence. She has tried Kegel exercises for the past month without much luck. I've referred her to gynecology today for consideration of pessary placement.   Other Orders Orders Placed This Encounter  Procedures  . Ambulatory referral to Gynecology    Referral Priority:  Routine    Referral Type:  Consultation    Referral Reason:  Specialty Services Required    Requested Specialty:  Gynecology    Number of Visits Requested:  1   Follow Up: Return in about 4 weeks (around 03/31/2016) for pap smear.

## 2016-03-03 NOTE — Patient Instructions (Signed)
Ms. Rayburn MaBlackmon,  It was great to meet you today.  We discussed three things:  1. Your ankle pain is from achilles teniditis. I've printed off some exercises. You can also take ibuprofen for your pain. If you continue to have pain, I'm happy to refer your to physical therapy.  2. For your intermittent right groin pain, I think this is Mittelshmerz phenomenon, which is pain from your egg being released from your ovary. I've referred you to gynecology to talk about different birth control options to help prevent the egg release.  3. For your urinary incontinence, continue trying Kegel exercises. I've referred your to gynecology for their opinion as well.  Take care, and we'll see you in 1 month for your pap smear, Dr. Earnest ConroyFlores

## 2016-03-03 NOTE — Assessment & Plan Note (Signed)
Her Achilles tendinitis of the right ankle is probably from overuse and wearing high heels. I recommended she avoid wearing high heels, I printed off some exercises for her, and recommended ibuprofen if she needs to walk for prolonged period. If she continues to have tendinitis, she would like to be referred to physical therapy, but would not like to do it today because of the cost.

## 2016-03-03 NOTE — Assessment & Plan Note (Signed)
I think her monthly, self resolving, sharp right lower quadrant pain sounds most like mittelschmerz to me. There is no inguinal bulge suggestive of a hernia, nor any paresthesias in the lateral femoral nerve distribution suggestive of meralgia paresthetica. She is on a Mirena IUD, which does not inhibit ovulation. She had been on birth control pills, but had forgot to take these, and became pregnant 7 years ago. I think switching the Mirena IUD to Implanon may be a good option, and she was interested in this. I've referred her to gynecology to continue the discussion.

## 2016-03-04 NOTE — Progress Notes (Signed)
Internal Medicine Clinic Attending  Case discussed with Dr. Flores at the time of the visit.  We reviewed the resident's history and exam and pertinent patient test results.  I agree with the assessment, diagnosis, and plan of care documented in the resident's note. 

## 2016-03-17 ENCOUNTER — Encounter: Payer: Self-pay | Admitting: Certified Nurse Midwife

## 2016-03-17 ENCOUNTER — Other Ambulatory Visit: Payer: Self-pay | Admitting: Certified Nurse Midwife

## 2016-03-17 ENCOUNTER — Ambulatory Visit (INDEPENDENT_AMBULATORY_CARE_PROVIDER_SITE_OTHER): Payer: Medicaid Other | Admitting: Certified Nurse Midwife

## 2016-03-17 ENCOUNTER — Ambulatory Visit: Payer: Medicaid Other | Admitting: Certified Nurse Midwife

## 2016-03-17 ENCOUNTER — Encounter (INDEPENDENT_AMBULATORY_CARE_PROVIDER_SITE_OTHER): Payer: Self-pay

## 2016-03-17 VITALS — BP 112/71 | HR 72 | Wt 202.0 lb

## 2016-03-17 DIAGNOSIS — Z01419 Encounter for gynecological examination (general) (routine) without abnormal findings: Secondary | ICD-10-CM

## 2016-03-17 DIAGNOSIS — N3946 Mixed incontinence: Secondary | ICD-10-CM

## 2016-03-17 DIAGNOSIS — Z0000 Encounter for general adult medical examination without abnormal findings: Secondary | ICD-10-CM

## 2016-03-17 DIAGNOSIS — Z1231 Encounter for screening mammogram for malignant neoplasm of breast: Secondary | ICD-10-CM

## 2016-03-17 DIAGNOSIS — R109 Unspecified abdominal pain: Secondary | ICD-10-CM

## 2016-03-17 MED ORDER — IBUPROFEN 800 MG PO TABS: 800.0000 mg | ORAL_TABLET | Freq: Three times a day (TID) | ORAL | Status: DC | PRN

## 2016-03-17 NOTE — Progress Notes (Signed)
Patient ID: Ralene Ok, female   DOB: 10/07/1976, 40 y.o.   MRN: [id number]    Subjective:      Crystal Salazar is a 40 y.o. female here for a routine exam.  Current complaints: urinary incontinence, with sneezing, coughing, movement.  Denies urgency.  States she goes a normal amount when she goes.  Uses panty liners in her underwear.  Has been to [facility].  Mirena IUD currently.  Monthly cycle with Mirena IUD lasting 3-5 days light, denies any clots or heavy bleeding.   Denies any suicidal/homicidal thoughts.   Has tried Kegel exercises.  Has lower right side cramping a few times a month.  Has bowel movement every other day, soft.     Declines blood work today.   Personal health questionnaire:  Is patient Ashkenazi Jewish, have a family history of breast and/or ovarian cancer: yes, maternal aunt BCA Is there a family history of uterine cancer diagnosed at age < 20, gastrointestinal cancer, urinary tract cancer, family member who is a Personnel officer syndrome-associated carrier: yes, PGF colon Is the patient overweight and hypertensive, family history of diabetes, personal history of gestational diabetes, preeclampsia or PCOS: yes Is patient over 67, have PCOS,  family history of premature CHD under age 60, diabetes, smoke, have hypertension or peripheral artery disease:  yes At any time, has a partner hit, kicked or otherwise hurt or frightened you?: no Over the past 2 weeks, have you felt down, depressed or hopeless?: yes, in the process of counseling Over the past 2 weeks, have you felt little interest or pleasure in doing things?:sometimes   Gynecologic History Patient's last menstrual period was 02/24/2016. Contraception: IUD Last Pap: 3 years ago. Results were: normal according to the patient Last mammogram: 2 years ago in [place]. Results were: normal according to the patient  Obstetric History OB History  Gravida Para Term Preterm AB SAB TAB Ectopic Multiple Living  # Outcome Date GA Lbr Len/2nd Weight Sex Delivery Anes PTL Lv  6 Gravida 03/14/08    M    Y     Comments: had a vanishing twin  5 Gravida 11/20/02    F    Y  4 Ectopic 07/28/01          3 SAB 12/28/00          2 Gravida 06/25/96    F    Y  1 Gravida 08/18/91    M    Y      History reviewed. No pertinent past medical history.  Past Surgical History  Procedure Laterality Date  . Ectopic pregnancy surgery    . Esophageal dilation       Current outpatient prescriptions:  .  levonorgestrel (MIRENA) 20 MCG/24HR IUD, 1 each by Intrauterine route once., Disp: , Rfl:  .  omeprazole (PRILOSEC) 20 MG capsule, Take 20 mg by mouth daily. Reported on 03/17/2016, Disp: , Rfl:  Allergies  Allergen Reactions  . Sulfonamide Derivatives     REACTION: Hives    Social History  Substance Use Topics  . Smoking status: Former Smoker    Quit date: 07/05/2015  . Smokeless tobacco: Not on file  . Alcohol Use: 0.0 oz/week    0 Standard drinks or equivalent per week     Comment: occ    Family History  Problem Relation Age of Onset  . Hypertension Mother   . Diabetes Mellitus  I Father   . Diabetes Father   . Hypertension Father   . Cancer Paternal Grandmother   . Cancer Paternal Grandfather       Review of Systems  Constitutional: negative for fatigue and weight loss Respiratory: negative for cough and wheezing Cardiovascular: negative for chest pain, fatigue and palpitations Gastrointestinal: negative for abdominal pain and change in bowel habits Musculoskeletal:negative for myalgias Neurological: negative for gait problems and tremors Behavioral/Psych: negative for abusive relationship, depression Endocrine: negative for temperature intolerance   Genitourinary:negative for abnormal menstrual periods, genital lesions, hot flashes, sexual problems and vaginal discharge Integument/breast: negative for breast lump, breast tenderness, nipple discharge and skin lesion(s)    Objective:        BP 112/71 mmHg  Pulse 72  Wt 202 lb (91.627 kg)  LMP 02/24/2016 General:   alert  Skin:   no rash or abnormalities  Lungs:   clear to auscultation bilaterally  Heart:   regular rate and rhythm, S1, S2 normal, no murmur, click, rub or gallop  Breasts:   normal without suspicious masses, skin or nipple changes or axillary nodes  Abdomen:  normal findings: no organomegaly, soft, non-tender and no hernia  Pelvis:  External genitalia: normal general appearance Urinary system: urethral meatus normal and bladder without fullness, nontender Vaginal: normal without tenderness, induration or masses Cervix: normal appearance, IUD strings present Adnexa: normal bimanual exam Uterus: anteverted and non-tender, normal size   Lab Review Urine pregnancy test Labs reviewed yes Radiologic studies reviewed no  50% of 30 min visit spent on counseling and coordination of care.   Assessment:    Healthy female exam.    Plan:    Education reviewed: calcium supplements, depression evaluation, low fat, low cholesterol diet, safe sex/STD prevention, self breast exams, skin cancer screening and weight bearing exercise. Mammogram ordered. Follow up in: 1 year.   No orders of the defined types were placed in this encounter.   Orders Placed This Encounter  Procedures  . MM DIGITAL SCREENING BILATERAL    Standing Status: Future     Number of Occurrences:      Standing Expiration Date: 05/17/2017    Order Specific Question:  Reason for Exam (SYMPTOM  OR DIAGNOSIS REQUIRED)    Answer:  annual exam    Order Specific Question:  Is the patient pregnant?    Answer:  No    Order Specific Question:  Preferred imaging location?    [facility]  . Ambulatory referral to Urology    Referral Priority:  Routine    Referral Type:  Consultation    Referral Reason:  Specialty Services Required    Requested Specialty:  Urology    Number of Visits Requested:  1   Need to obtain previous  records Possible management options include: Dr. Ashley RoyaltyMatthews for pelvic floor relaxation Follow up as needed.

## 2016-03-17 NOTE — Addendum Note (Signed)
Addended by: Marya LandryFOSTER, Sebastian Dzik D on: 03/17/2016 04:54 PM   Modules accepted: Orders

## 2016-03-17 NOTE — Addendum Note (Signed)
Addended by: Marya LandryFOSTER, Naje Rice D on: 03/17/2016 04:58 PM   Modules accepted: Orders

## 2016-03-17 NOTE — Patient Instructions (Signed)
Kegel Exercises  The goal of Kegel exercises is to isolate and exercise your pelvic floor muscles. These muscles act as a hammock that supports the rectum, vagina, small intestine, and uterus. As the muscles weaken, the hammock sags and these organs are displaced from their normal positions. Kegel exercises can strengthen your pelvic floor muscles and help you to improve bladder and bowel control, improve sexual response, and help reduce many problems and some discomfort during pregnancy. Kegel exercises can be done anywhere and at any time.  HOW TO PERFORM KEGEL EXERCISES  1. Locate your pelvic floor muscles. To do this, squeeze (contract) the muscles that you use when you try to stop the flow of urine. You will feel a tightness in the vaginal area (women) and a tight lift in the rectal area (men and women).  2. When you begin, contract your pelvic muscles tight for 2-5 seconds, then relax them for 2-5 seconds. This is one set. Do 4-5 sets with a short pause in between.  3. Contract your pelvic muscles for 8-10 seconds, then relax them for 8-10 seconds. Do 4-5 sets. If you cannot contract your pelvic muscles for 8-10 seconds, try 5-7 seconds and work your way up to 8-10 seconds. Your goal is 4-5 sets of 10 contractions each day.  Keep your stomach, buttocks, and legs relaxed during the exercises. Perform sets of both short and long contractions. Vary your positions. Perform these contractions 3-4 times per day. Perform sets while you are:    · Lying in bed in the morning.  · Standing at lunch.  · Sitting in the late afternoon.  · Lying in bed at night.   You should do 40-50 contractions per day. Do not perform more Kegel exercises per day than recommended. Overexercising can cause muscle fatigue. Continue these exercises for for at least 15-20 weeks or as directed by your caregiver.     This information is not intended to replace advice given to you by your health care provider. Make sure you discuss any questions  you have with your health care provider.     Document Released: 11/30/2012 Document Revised: 01/04/2015 Document Reviewed: 11/30/2012  Elsevier Interactive Patient Education ©2016 [employer]

## 2016-03-19 LAB — NUSWAB VAGINITIS PLUS (VG+)
Atopobium vaginae: HIGH Score — AB
BVAB 2: HIGH Score — AB
CANDIDA ALBICANS, NAA: NEGATIVE
CHLAMYDIA TRACHOMATIS, NAA: NEGATIVE
Candida glabrata, NAA: NEGATIVE
Megasphaera 1: HIGH Score — AB
NEISSERIA GONORRHOEAE, NAA: NEGATIVE
TRICH VAG BY NAA: NEGATIVE

## 2016-03-20 ENCOUNTER — Other Ambulatory Visit: Payer: Self-pay | Admitting: Certified Nurse Midwife

## 2016-03-20 DIAGNOSIS — N760 Acute vaginitis: Principal | ICD-10-CM

## 2016-03-20 DIAGNOSIS — B9689 Other specified bacterial agents as the cause of diseases classified elsewhere: Secondary | ICD-10-CM

## 2016-03-20 LAB — PAP IG AND HPV HIGH-RISK
HPV, HIGH-RISK: NEGATIVE
PAP SMEAR COMMENT: 0

## 2016-03-20 MED ORDER — METRONIDAZOLE 500 MG PO TABS: 500.0000 mg | ORAL_TABLET | Freq: Two times a day (BID) | ORAL | Status: DC

## 2016-03-30 ENCOUNTER — Ambulatory Visit
Admission: RE | Admit: 2016-03-30 | Discharge: 2016-03-30 | Disposition: A | Discharge status: Home / Self Care | Payer: Medicaid Other | Source: Ambulatory Visit | Attending: Certified Nurse Midwife | Admitting: Certified Nurse Midwife

## 2016-03-30 DIAGNOSIS — Z1231 Encounter for screening mammogram for malignant neoplasm of breast: Secondary | ICD-10-CM

## 2016-04-06 NOTE — Addendum Note (Signed)
Addended by: Neomia DearPOWERS, Tationna Fullard E on: 04/06/2016 05:44 PM   Modules accepted: Orders

## 2016-04-29 ENCOUNTER — Encounter: Payer: Self-pay | Admitting: *Deleted

## 2016-07-13 ENCOUNTER — Encounter: Payer: Self-pay | Admitting: *Deleted

## 2016-08-26 ENCOUNTER — Encounter: Payer: Medicaid Other | Admitting: Internal Medicine

## 2016-09-02 ENCOUNTER — Encounter: Payer: Medicaid Other | Admitting: Internal Medicine

## 2016-09-04 ENCOUNTER — Telehealth: Payer: Self-pay | Admitting: Internal Medicine

## 2016-09-04 NOTE — Telephone Encounter (Signed)
APT. REMINDER CALL, LMTCB °

## 2016-09-07 ENCOUNTER — Other Ambulatory Visit (HOSPITAL_COMMUNITY)
Admission: RE | Admit: 2016-09-07 | Discharge: 2016-09-07 | Disposition: A | Discharge status: Home / Self Care | Payer: Medicaid Other | Source: Ambulatory Visit | Attending: Internal Medicine | Admitting: Internal Medicine

## 2016-09-07 ENCOUNTER — Ambulatory Visit (INDEPENDENT_AMBULATORY_CARE_PROVIDER_SITE_OTHER): Payer: Medicaid Other | Admitting: Internal Medicine

## 2016-09-07 ENCOUNTER — Encounter: Payer: Self-pay | Admitting: Internal Medicine

## 2016-09-07 VITALS — BP 107/61 | HR 56 | Temp 98.0°F | Wt 196.8 lb

## 2016-09-07 DIAGNOSIS — N760 Acute vaginitis: Secondary | ICD-10-CM | POA: Insufficient documentation

## 2016-09-07 DIAGNOSIS — R51 Headache: Secondary | ICD-10-CM | POA: Diagnosis not present

## 2016-09-07 DIAGNOSIS — N898 Other specified noninflammatory disorders of vagina: Secondary | ICD-10-CM | POA: Diagnosis not present

## 2016-09-07 DIAGNOSIS — R42 Dizziness and giddiness: Secondary | ICD-10-CM | POA: Diagnosis present

## 2016-09-07 DIAGNOSIS — Z0000 Encounter for general adult medical examination without abnormal findings: Secondary | ICD-10-CM | POA: Insufficient documentation

## 2016-09-07 DIAGNOSIS — R519 Headache, unspecified: Secondary | ICD-10-CM | POA: Insufficient documentation

## 2016-09-07 DIAGNOSIS — G44211 Episodic tension-type headache, intractable: Secondary | ICD-10-CM

## 2016-09-07 NOTE — Progress Notes (Signed)
   CC: vaginal discharge  HPI:  [name] is a 40 y.o. w/ PMHx of stress incontinence and achilles tendinitis who presents to clinic for vaginal d/c that has increased in amt associated with itching. She reports she has increased vaginal d/c 2 weeks before and after her menstrual cycle and is concerned she has an infection. She is also complaining of HA's x 3 weeks and intermittent dizziness. Please see problem list for further details.   Review of Systems:  Denies dysuria, fevers, decreased appetite. Positive for dizziness, HA, and vaginal d/c.   Physical Exam:  Vitals:   09/07/16 1624  BP: 107/61  Pulse: (!) 56  Temp: 98 F (36.7 C)  TempSrc: Oral  SpO2: 100%  Weight: 196 lb 12.8 oz (89.3 kg)   Physical Exam  Constitutional: She appears well-developed and well-nourished. No distress.  HENT:  Head: Normocephalic and atraumatic.  Nose: Nose normal.  Eyes: Conjunctivae and EOM are normal. Right eye exhibits no discharge. Left eye exhibits no discharge.  Cardiovascular: Normal rate, regular rhythm and normal heart sounds.  Exam reveals no gallop and no friction rub.   No murmur heard. Pulmonary/Chest: Effort normal and breath sounds normal. No respiratory distress. She has no wheezes. She has no rales. She exhibits no tenderness.  Abdominal: Soft. Bowel sounds are normal. She exhibits no distension. There is no tenderness. There is no rebound and no guarding.  Genitourinary: There is no rash, tenderness, lesion or injury on the right labia. There is no rash, tenderness, lesion or injury on the left labia. Vaginal discharge (minimal to moderate off white non odorous discharge, IUD strings visualized, cervix non erythematous ) found.  Skin: Skin is warm and dry. No rash noted. She is not diaphoretic. No erythema. No pallor.    Assessment & Plan:   See Encounters Tab for problem based charting.  Patient discussed with Dr. Josem KaufmannKlima

## 2016-09-07 NOTE — Assessment & Plan Note (Signed)
For the past 3 weeks patient has had intermittent left sided temporal headaches that are throbbing in nature. They do not occur daily and are not associated w/ photophobia, n/v. She reports that she had a CT Head done in the past due to HA sx and it was normal. She take aleve for her headaches which help relieve pain. She denies any increased stress. She does not drink much caffeinated beverages, she does not smoke, and she drinks EtOH socially. Denies fevers and decreased appetite. On exam she does not have any TMJ crepitus, occipital muscle point tenderness, or sinus tenderness. She does not have any sx that are concerning for malignancy or migraines. Sx consistent with tension headaches, could also be due to medication overuse.   - advised pt to keep a headache diary if headaches worsen or do not improve - if not improved will determine medication use for headaches at next visit.

## 2016-09-07 NOTE — Assessment & Plan Note (Addendum)
Refused flu shot. Pap and mammogram up to date.   - checking HIV--neg

## 2016-09-07 NOTE — Assessment & Plan Note (Addendum)
Pt reports increased off white vaginal discharge that does not have a foul odor to it. She reports she has this off and on 2 weeks before and 2 weeks after her menstrual cycle. She is sexually active w/ her husband and does not use barrier contraception. She has associated labial pruritis w/ vaginal discharge but denies cottage cheese like discharge. She had a pap smear in 02/2016 that was + for BV and she completed a 7 day course of flagyl. Denies dysuria and fevers. Pelvic exam revealed minimal to moderate amount of off white non odorous vaginal d/c that appear to be nl physiologic discharge. Cervix was non erythematous, IUD strings visualized.   - cytology sent for gardenella, BV, candida, GC/chlaymdia.   Addendum 9/18: attempted to call pt at both home numbers and cell phone number all of which did not have a voice mail option. 2nd home number is not correct. Unable to inform pt of her + gardenella results. Will ask RN to send pt a letter.

## 2016-09-07 NOTE — Assessment & Plan Note (Addendum)
Pt reports occasional dizziness where she feels unsteady. She will notice this when she has been sitting for a prolonged amount of time and then has to move. She denies dizziness when she sits up from supine sleeping position in the morning. Her BP is low normal at 107/62 today and pulse is 56. She denies hypothyroidism sx such as fatigue, weight gain, and dry skin. She has hx of heavy menstrual cycles which has improved w/ IUD. Denies bloody stools or pica sx. She has never been tested for diabetes and has not had any recent hgb values on EPIC. Dizziness could be due to anemia, hypoglycemia, bradycardia or orthostatic hypotension.   - checking hgba1c and CBC which came back WNL - at next visit can get a EKG to confirm sinus bradycardia and consider checking a TSH.

## 2016-09-08 LAB — CBC
HEMOGLOBIN: 11.6 g/dL (ref 11.1–15.9)
Hematocrit: 34.9 % (ref 34.0–46.6)
MCH: 31.8 pg (ref 26.6–33.0)
MCHC: 33.2 g/dL (ref 31.5–35.7)
MCV: 96 fL (ref 79–97)
Platelets: 287 10*3/uL (ref 150–379)
RBC: 3.65 x10E6/uL — AB (ref 3.77–5.28)
RDW: 12.8 % (ref 12.3–15.4)
WBC: 5.2 10*3/uL (ref 3.4–10.8)

## 2016-09-08 LAB — CERVICOVAGINAL ANCILLARY ONLY: WET PREP (BD AFFIRM): POSITIVE — AB

## 2016-09-08 LAB — HIV ANTIBODY (ROUTINE TESTING W REFLEX): HIV SCREEN 4TH GENERATION: NONREACTIVE

## 2016-09-08 LAB — HEMOGLOBIN A1C
Est. average glucose Bld gHb Est-mCnc: 100 mg/dL
HEMOGLOBIN A1C: 5.1 % (ref 4.8–5.6)

## 2016-09-08 NOTE — Progress Notes (Signed)
Case discussed with Dr. Truong at the time of the visit.  We reviewed the resident's history and exam and pertinent patient test results.  I agree with the assessment, diagnosis, and plan of care documented in the resident's note. 

## 2017-03-18 ENCOUNTER — Ambulatory Visit: Payer: Medicaid Other | Admitting: Obstetrics and Gynecology

## 2017-03-18 ENCOUNTER — Ambulatory Visit: Payer: Self-pay | Admitting: Certified Nurse Midwife

## 2017-04-01 ENCOUNTER — Ambulatory Visit: Payer: Medicaid Other | Admitting: Obstetrics & Gynecology

## 2017-04-20 ENCOUNTER — Encounter: Payer: Self-pay | Admitting: *Deleted

## 2017-04-20 ENCOUNTER — Other Ambulatory Visit: Payer: Self-pay | Admitting: Obstetrics and Gynecology

## 2017-04-20 ENCOUNTER — Ambulatory Visit (INDEPENDENT_AMBULATORY_CARE_PROVIDER_SITE_OTHER): Payer: Medicaid Other | Admitting: Obstetrics and Gynecology

## 2017-04-20 ENCOUNTER — Other Ambulatory Visit (HOSPITAL_COMMUNITY)
Admission: RE | Admit: 2017-04-20 | Discharge: 2017-04-20 | Disposition: A | Discharge status: Home / Self Care | Payer: Medicaid Other | Source: Ambulatory Visit | Attending: Obstetrics and Gynecology | Admitting: Obstetrics and Gynecology

## 2017-04-20 ENCOUNTER — Encounter: Payer: Self-pay | Admitting: Obstetrics and Gynecology

## 2017-04-20 VITALS — BP 114/70 | HR 70 | Ht 62.0 in | Wt 193.1 lb

## 2017-04-20 DIAGNOSIS — Z01419 Encounter for gynecological examination (general) (routine) without abnormal findings: Secondary | ICD-10-CM

## 2017-04-20 DIAGNOSIS — Z0000 Encounter for general adult medical examination without abnormal findings: Secondary | ICD-10-CM

## 2017-04-20 DIAGNOSIS — Z1231 Encounter for screening mammogram for malignant neoplasm of breast: Secondary | ICD-10-CM

## 2017-04-20 NOTE — Progress Notes (Signed)
Patient is in the office for annual exam, declines std testing, has Mirena for birth control. LMP was around the end of March, Pt complains of occasional pelvic cramping.   Crystal Salazar is a 41 y.o. [phone] female here for a routine annual gynecologic exam.  She reports occ cramp with cycle. Has monthly cycles which last 4-5 days. Mirena for contraception. Sexual active without problems. Occ stress urinary incont.      Gynecologic History Patient's last menstrual period was 03/25/2017. Contraception: IUD Last Pap: 3/17. Results were: normal Last mammogram: 4/17. Results were: normal  Obstetric History OB History  Gravida Para Term Preterm AB Living  SAB TAB Ectopic Multiple Live Births  # Outcome Date GA Lbr Len/2nd Weight Sex Delivery Anes PTL Lv  6 Gravida 03/14/08    M    LIV     Birth Comments: had a vanishing twin  5 Gravida 11/20/02    F    LIV  4 Ectopic 07/28/01          3 SAB 12/28/00          2 Gravida 06/25/96    F    LIV  1 Gravida 08/18/91    M    LIV      History reviewed. No pertinent past medical history.  Past Surgical History:  Procedure Laterality Date  . ECTOPIC PREGNANCY SURGERY    . ESOPHAGEAL DILATION      Current Outpatient Prescriptions on File Prior to Visit  Medication Sig Dispense Refill  . levonorgestrel (MIRENA) 20 MCG/24HR IUD 1 each by Intrauterine route once.    Marland Kitchen omeprazole (PRILOSEC) 20 MG capsule Take 20 mg by mouth daily. Reported on 03/17/2016     No current facility-administered medications on file prior to visit.     Allergies  Allergen Reactions  . Sulfonamide Derivatives     REACTION: Hives    Social History   Social History  . Marital status: Married    Spouse name: N/A  . Number of children: N/A  . Years of education: N/A   Occupational History  . Not on file.   Social History Main Topics  . Smoking status: Former Smoker    Quit date: 07/05/2015  . Smokeless tobacco: Not on file  .  Alcohol use 0.0 oz/week     Comment: occ  . Drug use: No  . Sexual activity: Yes    Birth control/ protection: IUD   Other Topics Concern  . Not on file   Social History Narrative  . No narrative on file    Family History  Problem Relation Age of Onset  . Hypertension Mother   . Diabetes Mellitus I Father   . Diabetes Father   . Hypertension Father   . Cancer Paternal Grandmother   . Cancer Paternal Grandfather     The following portions of the patient's history were reviewed and updated as appropriate: allergies, current medications, past family history, past medical history, past social history, past surgical history and problem list.  Review of Systems Pertinent items are noted in HPI.   Objective:  BP 114/70   Pulse 70   Ht  (1.575 m)   Wt 193 lb 1.6 oz (87.6 kg)   LMP 03/25/2017   BMI 35.32 kg/m  CONSTITUTIONAL: Well-developed, well-nourished female in no acute distress.  HENT:  Normocephalic, atraumatic, External right  and left ear normal. Oropharynx is clear and moist EYES: Conjunctivae and EOM are normal. Pupils are equal, round, and reactive to light. No scleral icterus.  NECK: Normal range of motion, supple, no masses.  Normal thyroid.  SKIN: Skin is warm and dry. No rash noted. Not diaphoretic. No erythema. No pallor. NEUROLGIC: Alert and oriented to person, place, and time. Normal reflexes, muscle tone coordination. No cranial nerve deficit noted. PSYCHIATRIC: Normal mood and affect. Normal behavior. Normal judgment and thought content. CARDIOVASCULAR: Normal heart rate noted, regular rhythm RESPIRATORY: Clear to auscultation bilaterally. Effort and breath sounds normal, no problems with respiration noted. BREASTS: Symmetric in size. No masses, skin changes, nipple drainage, or lymphadenopathy. ABDOMEN: Soft, normal bowel sounds, no distention noted.  No tenderness, rebound or guarding.  PELVIC: Normal appearing external genitalia; normal appearing  vaginal mucosa and cervix.  No abnormal discharge noted. IUD strings noted  Pap smear obtained.  Normal uterine size, no other palpable masses, no uterine or adnexal tenderness. MUSCULOSKELETAL: Normal range of motion. No tenderness.  No cyanosis, clubbing, or edema.  2+ distal pulses.   Assessment:  Annual gynecologic examination with pap smear   Plan:  Will follow up results of pap smear and manage accordingly. Mammogram scheduled Routine preventative health maintenance measures emphasized. Please refer to After Visit Summary for other counseling recommendations.    Hermina Staggers, MD, FACOG Attending Obstetrician & Gynecologist Center for [facility], [facility]

## 2017-04-20 NOTE — Patient Instructions (Signed)
Health Maintenance, Female Adopting a healthy lifestyle and getting preventive care can go a long way to promote health and wellness. Talk with your health care provider about what schedule of regular examinations is right for you. This is a good chance for you to check in with your provider about disease prevention and staying healthy. In between checkups, there are plenty of things you can do on your own. Experts have done a lot of research about which lifestyle changes and preventive measures are most likely to keep you healthy. Ask your health care provider for more information. Weight and diet Eat a healthy diet  Be sure to include plenty of vegetables, fruits, low-fat dairy products, and lean protein.  Do not eat a lot of foods high in solid fats, added sugars, or salt.  Get regular exercise. This is one of the most important things you can do for your health.  Most adults should exercise for at least 150 minutes each week. The exercise should increase your heart rate and make you sweat (moderate-intensity exercise).  Most adults should also do strengthening exercises at least twice a week. This is in addition to the moderate-intensity exercise. Maintain a healthy weight  Body mass index (BMI) is a measurement that can be used to identify possible weight problems. It estimates body fat based on height and weight. Your health care provider can help determine your BMI and help you achieve or maintain a healthy weight.  For females 76 years of age and older:  A BMI below 18.5 is considered underweight.  A BMI of 18.5 to 24.9 is normal.  A BMI of 25 to 29.9 is considered overweight.  A BMI of 30 and above is considered obese. Watch levels of cholesterol and blood lipids  You should start having your blood tested for lipids and cholesterol at 43 years of age, then have this test every 5 years.  You may need to have your cholesterol levels checked more often if:  Your lipid or  cholesterol levels are high.  You are older than 43 years of age.  You are at high risk for heart disease. Cancer screening Lung Cancer  Lung cancer screening is recommended for adults 64-42 years old who are at high risk for lung cancer because of a history of smoking.  A yearly low-dose CT scan of the lungs is recommended for people who:  Currently smoke.  Have quit within the past 15 years.  Have at least a 30-pack-year history of smoking. A pack year is smoking an average of one pack of cigarettes a day for 1 year.  Yearly screening should continue until it has been 15 years since you quit.  Yearly screening should stop if you develop a health problem that would prevent you from having lung cancer treatment. Breast Cancer  Practice breast self-awareness. This means understanding how your breasts normally appear and feel.  It also means doing regular breast self-exams. Let your health care provider know about any changes, no matter how small.  If you are in your 20s or 30s, you should have a clinical breast exam (CBE) by a health care provider every 1-3 years as part of a regular health exam.  If you are 34 or older, have a CBE every year. Also consider having a breast X-ray (mammogram) every year.  If you have a family history of breast cancer, talk to your health care provider about genetic screening.  If you are at high risk for breast cancer, talk  to your health care provider about having an MRI and a mammogram every year.  Breast cancer gene (BRCA) assessment is recommended for women who have family members with BRCA-related cancers. BRCA-related cancers include:  Breast.  Ovarian.  Tubal.  Peritoneal cancers.  Results of the assessment will determine the need for genetic counseling and BRCA1 and BRCA2 testing. Cervical Cancer  Your health care provider may recommend that you be screened regularly for cancer of the pelvic organs (ovaries, uterus, and vagina).  This screening involves a pelvic examination, including checking for microscopic changes to the surface of your cervix (Pap test). You may be encouraged to have this screening done every 3 years, beginning at age 24.  For women ages 66-65, health care providers may recommend pelvic exams and Pap testing every 3 years, or they may recommend the Pap and pelvic exam, combined with testing for human papilloma virus (HPV), every 5 years. Some types of HPV increase your risk of cervical cancer. Testing for HPV may also be done on women of any age with unclear Pap test results.  Other health care providers may not recommend any screening for nonpregnant women who are considered low risk for pelvic cancer and who do not have symptoms. Ask your health care provider if a screening pelvic exam is right for you.  If you have had past treatment for cervical cancer or a condition that could lead to cancer, you need Pap tests and screening for cancer for at least 20 years after your treatment. If Pap tests have been discontinued, your risk factors (such as having a new sexual partner) need to be reassessed to determine if screening should resume. Some women have medical problems that increase the chance of getting cervical cancer. In these cases, your health care provider may recommend more frequent screening and Pap tests. Colorectal Cancer  This type of cancer can be detected and often prevented.  Routine colorectal cancer screening usually begins at 43 years of age and continues through 43 years of age.  Your health care provider may recommend screening at an earlier age if you have risk factors for colon cancer.  Your health care provider may also recommend using home test kits to check for hidden blood in the stool.  A small camera at the end of a tube can be used to examine your colon directly (sigmoidoscopy or colonoscopy). This is done to check for the earliest forms of colorectal cancer.  Routine  screening usually begins at age 41.  Direct examination of the colon should be repeated every 5-10 years through 43 years of age. However, you may need to be screened more often if early forms of precancerous polyps or small growths are found. Skin Cancer  Check your skin from head to toe regularly.  Tell your health care provider about any new moles or changes in moles, especially if there is a change in a mole's shape or color.  Also tell your health care provider if you have a mole that is larger than the size of a pencil eraser.  Always use sunscreen. Apply sunscreen liberally and repeatedly throughout the day.  Protect yourself by wearing long sleeves, pants, a wide-brimmed hat, and sunglasses whenever you are outside. Heart disease, diabetes, and high blood pressure  High blood pressure causes heart disease and increases the risk of stroke. High blood pressure is more likely to develop in:  People who have blood pressure in the high end of the normal range (130-139/85-89 mm Hg).  People who are overweight or obese.  People who are African American.  If you are 59-24 years of age, have your blood pressure checked every 3-5 years. If you are 34 years of age or older, have your blood pressure checked every year. You should have your blood pressure measured twice-once when you are at a hospital or clinic, and once when you are not at a hospital or clinic. Record the average of the two measurements. To check your blood pressure when you are not at a hospital or clinic, you can use:  An automated blood pressure machine at a pharmacy.  A home blood pressure monitor.  If you are between 29 years and 60 years old, ask your health care provider if you should take aspirin to prevent strokes.  Have regular diabetes screenings. This involves taking a blood sample to check your fasting blood sugar level.  If you are at a normal weight and have a low risk for diabetes, have this test once  every three years after 43 years of age.  If you are overweight and have a high risk for diabetes, consider being tested at a younger age or more often. Preventing infection Hepatitis B  If you have a higher risk for hepatitis B, you should be screened for this virus. You are considered at high risk for hepatitis B if:  You were born in a country where hepatitis B is common. Ask your health care provider which countries are considered high risk.  Your parents were born in a high-risk country, and you have not been immunized against hepatitis B (hepatitis B vaccine).  You have HIV or AIDS.  You use needles to inject street drugs.  You live with someone who has hepatitis B.  You have had sex with someone who has hepatitis B.  You get hemodialysis treatment.  You take certain medicines for conditions, including cancer, organ transplantation, and autoimmune conditions. Hepatitis C  Blood testing is recommended for:  Everyone born from [birth year] through 1967.  Anyone with known risk factors for hepatitis C. Sexually transmitted infections (STIs)  You should be screened for sexually transmitted infections (STIs) including gonorrhea and chlamydia if:  You are sexually active and are younger than 43 years of age.  You are older than 43 years of age and your health care provider tells you that you are at risk for this type of infection.  Your sexual activity has changed since you were last screened and you are at an increased risk for chlamydia or gonorrhea. Ask your health care provider if you are at risk.  If you do not have HIV, but are at risk, it may be recommended that you take a prescription medicine daily to prevent HIV infection. This is called pre-exposure prophylaxis (PrEP). You are considered at risk if:  You are sexually active and do not regularly use condoms or know the HIV status of your partner(s).  You take drugs by injection.  You are sexually active with a partner  who has HIV. Talk with your health care provider about whether you are at high risk of being infected with HIV. If you choose to begin PrEP, you should first be tested for HIV. You should then be tested every 3 months for as long as you are taking PrEP. Pregnancy  If you are premenopausal and you may become pregnant, ask your health care provider about preconception counseling.  If you may become pregnant, take 400 to 800 micrograms (mcg) of folic acid  every day.  If you want to prevent pregnancy, talk to your health care provider about birth control (contraception). Osteoporosis and menopause  Osteoporosis is a disease in which the bones lose minerals and strength with aging. This can result in serious bone fractures. Your risk for osteoporosis can be identified using a bone density scan.  If you are 4 years of age or older, or if you are at risk for osteoporosis and fractures, ask your health care provider if you should be screened.  Ask your health care provider whether you should take a calcium or vitamin D supplement to lower your risk for osteoporosis.  Menopause may have certain physical symptoms and risks.  Hormone replacement therapy may reduce some of these symptoms and risks. Talk to your health care provider about whether hormone replacement therapy is right for you. Follow these instructions at home:  Schedule regular health, dental, and eye exams.  Stay current with your immunizations.  Do not use any tobacco products including cigarettes, chewing tobacco, or electronic cigarettes.  If you are pregnant, do not drink alcohol.  If you are breastfeeding, limit how much and how often you drink alcohol.  Limit alcohol intake to no more than 1 drink per day for nonpregnant women. One drink equals 12 ounces of beer, 5 ounces of wine, or 1 ounces of hard liquor.  Do not use street drugs.  Do not share needles.  Ask your health care provider for help if you need support  or information about quitting drugs.  Tell your health care provider if you often feel depressed.  Tell your health care provider if you have ever been abused or do not feel safe at home. This information is not intended to replace advice given to you by your health care provider. Make sure you discuss any questions you have with your health care provider. Document Released: 06/29/2011 Document Revised: 05/21/2016 Document Reviewed: 09/17/2015 Elsevier Interactive Patient Education  2017 [employer].

## 2017-04-26 LAB — CYTOLOGY - PAP
ADEQUACY: ABSENT
Diagnosis: NEGATIVE
HPV (WINDOPATH): NOT DETECTED

## 2017-05-17 ENCOUNTER — Ambulatory Visit
Admission: RE | Admit: 2017-05-17 | Discharge: 2017-05-17 | Disposition: A | Discharge status: Home / Self Care | Payer: Medicaid Other | Source: Ambulatory Visit | Attending: Obstetrics and Gynecology | Admitting: Obstetrics and Gynecology

## 2017-05-17 DIAGNOSIS — Z1231 Encounter for screening mammogram for malignant neoplasm of breast: Secondary | ICD-10-CM

## 2017-05-19 ENCOUNTER — Other Ambulatory Visit: Payer: Self-pay | Admitting: Obstetrics and Gynecology

## 2017-05-19 DIAGNOSIS — R928 Other abnormal and inconclusive findings on diagnostic imaging of breast: Secondary | ICD-10-CM

## 2017-06-04 ENCOUNTER — Ambulatory Visit
Admission: RE | Admit: 2017-06-04 | Discharge: 2017-06-04 | Disposition: A | Discharge status: Home / Self Care | Payer: Medicaid Other | Source: Ambulatory Visit | Attending: Obstetrics and Gynecology | Admitting: Obstetrics and Gynecology

## 2017-06-04 DIAGNOSIS — R928 Other abnormal and inconclusive findings on diagnostic imaging of breast: Secondary | ICD-10-CM

## 2017-06-08 ENCOUNTER — Encounter: Payer: Self-pay | Admitting: *Deleted

## 2018-04-27 ENCOUNTER — Other Ambulatory Visit: Payer: Self-pay

## 2018-04-27 ENCOUNTER — Ambulatory Visit: Payer: Medicaid Other | Admitting: Internal Medicine

## 2018-04-27 ENCOUNTER — Encounter: Payer: Self-pay | Admitting: Internal Medicine

## 2018-04-27 VITALS — BP 125/65 | HR 66 | Temp 98.2°F | Ht 62.0 in | Wt 193.4 lb

## 2018-04-27 DIAGNOSIS — Z975 Presence of (intrauterine) contraceptive device: Secondary | ICD-10-CM

## 2018-04-27 DIAGNOSIS — Z23 Encounter for immunization: Secondary | ICD-10-CM | POA: Diagnosis not present

## 2018-04-27 DIAGNOSIS — Z713 Dietary counseling and surveillance: Secondary | ICD-10-CM | POA: Diagnosis not present

## 2018-04-27 DIAGNOSIS — K219 Gastro-esophageal reflux disease without esophagitis: Secondary | ICD-10-CM | POA: Diagnosis not present

## 2018-04-27 DIAGNOSIS — G5603 Carpal tunnel syndrome, bilateral upper limbs: Secondary | ICD-10-CM | POA: Diagnosis not present

## 2018-04-27 DIAGNOSIS — Z6835 Body mass index (BMI) 35.0-35.9, adult: Secondary | ICD-10-CM

## 2018-04-27 DIAGNOSIS — Z0000 Encounter for general adult medical examination without abnormal findings: Secondary | ICD-10-CM

## 2018-04-27 DIAGNOSIS — Z79899 Other long term (current) drug therapy: Secondary | ICD-10-CM | POA: Diagnosis not present

## 2018-04-27 MED ORDER — OMEPRAZOLE 20 MG PO CPDR: 20.0000 mg | DELAYED_RELEASE_CAPSULE | Freq: Every day | ORAL | 3 refills | Status: DC

## 2018-04-27 NOTE — Progress Notes (Signed)
   CC: carpal tunnel  HPI:  [name] is a 42 y.o. female with PMH of GERD presenting for routine follow up.  Please see encounter based charting for a detailed description of the patient's acute and chronic medical problems.     Review of Systems:   Review of Systems  Constitutional: Negative for chills, fever and malaise/fatigue.  Respiratory: Negative for shortness of breath.   Cardiovascular: Negative for chest pain.  Gastrointestinal: Negative for blood in stool, constipation, heartburn, melena, nausea and vomiting.  Genitourinary: Negative for dysuria and hematuria.  Musculoskeletal: Negative.   Neurological: Positive for tingling (related to carpal tunnel) and weakness (related to carpal tunnel).  Psychiatric/Behavioral: Negative.     Physical Exam:  Vitals:   04/27/18 1610  BP: 125/65  Pulse: 66  Temp: 98.2 F (36.8 C)  TempSrc: Oral  SpO2: 100%  Weight: 193 lb 6.4 oz (87.7 kg)  Height:  (1.575 m)   Physical Exam  Constitutional: She is oriented to person, place, and time. She appears well-developed and well-nourished. No distress.  HENT:  Head: Normocephalic and atraumatic.  Eyes: Conjunctivae and EOM are normal. No scleral icterus.  Neck: Normal range of motion. Neck supple. No thyromegaly present.  Cardiovascular: Normal rate, regular rhythm, normal heart sounds and intact distal pulses.  Pulmonary/Chest: Effort normal and breath sounds normal.  Abdominal: Soft. Bowel sounds are normal. There is no tenderness.  Musculoskeletal: Normal range of motion. She exhibits no edema.  No hypothenar atrophy noted in hands bilaterally. +phalen test bilaterally.   Lymphadenopathy:    She has no cervical adenopathy.  Neurological: She is alert and oriented to person, place, and time. She has normal strength. She displays no atrophy. No cranial nerve deficit or sensory deficit.  Skin: Skin is warm and dry.    Assessment & Plan:   See Encounters Tab for  problem based charting.  Patient discussed with Dr. Cleda Daub

## 2018-04-27 NOTE — Patient Instructions (Addendum)
Crystal Salazar,   It was nice meeting you today.  I have sent a referral to orthopedic surgery to help manage your carpal tunnel.  I have made no changes or any adjustments to your medications.  Please follow-up with your OB/GYN to discuss your IUD.  I believe you have 1 more year before need to be replaced.  Return to clinic in 6 months to 1 year for routine checkup.  Do not hesitate to return sooner if any medical problems arise.

## 2018-04-28 ENCOUNTER — Encounter: Payer: Self-pay | Admitting: Internal Medicine

## 2018-04-28 ENCOUNTER — Telehealth: Payer: Self-pay | Admitting: *Deleted

## 2018-04-28 DIAGNOSIS — K219 Gastro-esophageal reflux disease without esophagitis: Secondary | ICD-10-CM | POA: Insufficient documentation

## 2018-04-28 DIAGNOSIS — Z975 Presence of (intrauterine) contraceptive device: Secondary | ICD-10-CM | POA: Insufficient documentation

## 2018-04-28 DIAGNOSIS — Z6835 Body mass index (BMI) 35.0-35.9, adult: Secondary | ICD-10-CM | POA: Insufficient documentation

## 2018-04-28 DIAGNOSIS — G5603 Carpal tunnel syndrome, bilateral upper limbs: Secondary | ICD-10-CM | POA: Insufficient documentation

## 2018-04-28 NOTE — Assessment & Plan Note (Signed)
Administered Tdap 04/27/2017.

## 2018-04-28 NOTE — Assessment & Plan Note (Signed)
Denies reflux symptoms. States well controlled on omeprazole 20 mg daily.   -Refilled Omeprazole 20 mg daily

## 2018-04-28 NOTE — Assessment & Plan Note (Signed)
Patient states she was previously diagnosed with carpal tunnel. She states it is getting progressively worse, especially at night. She has pain that started in her hands and wrists, which radiates up her arms. She also has tingling and numbness associated with the pain. Her right hand is worse than her left. She also complains of intermittent weakness. The patient uses her hands at work and does repetitive movements, which she states exacerbates her symptoms. She previously had an injection in her right hand that significantly improved her symptoms, but she cannot remember where or when she had that procedure done. She does have wrist splints, which she wears occasionally at bedtime.   On exam, patient exhibits no thenar atrophy bilaterally. She is Phalen sign positive, right greater than left. Her strength is intact and she does not have sensory deficits.   A/P: Patient's symptoms seem to be progressively worsening and the weakness, tingling, and pain she has been experiencing is affecting her at work, as well as disrupting sleep. She may be a surgical candidate.   -Encouraged patient to wear wrist splits every night -Referral to orthopedic surgery

## 2018-04-28 NOTE — Telephone Encounter (Signed)
UNABLE TO CONTACT PATIENT BY PHONE(HOUSE/CELL) VOICE MAIL EITHER FULL / NOT SET UP.  ORTHO REFERRAL SENT TO [facility] Q FOR REVIEW / SCHEDULING.   NEEDING TO KNOW IF SHE HAS SEEN [facility] IN PAST, BUT UNABLE TO LVM.

## 2018-04-28 NOTE — Assessment & Plan Note (Signed)
Patient inquiring if she needs to have her IUD replaced, as she cannot remember when it was placed. She denies any problems with the IUD. Per chart review, she may have had it placed in 2015, but this is not entirely clear. Mirena can be in place for up to five years. She was seen by Ob/gyn Dr. Alysia Penna last year. I recommended the patient make a follow up appointment and discuss IUD removal/replacement with him, as our clinic does not remove or replace IUDs.   Plan: -Provided patient with phone number and address of Dr. Waynard Edwards office  -Patient plans to make follow up appointment

## 2018-04-28 NOTE — Assessment & Plan Note (Signed)
Recommended weight loss and discussed the patient's diet. She states she drinks a lot of soda and tends to eat a lot of breads. I discussed decreasing soda intake and bread consumption, I also recommended portion control. The patient states she is always on her feet at work and is active, but does not exercise regularly. Recommended daily exercise and 30 minutes of moderate intensity exercise.

## 2018-04-29 NOTE — Progress Notes (Signed)
Internal Medicine Clinic Attending  Case discussed with Dr. LaCroce at the time of the visit.  We reviewed the resident's history and exam and pertinent patient test results.  I agree with the assessment, diagnosis, and plan of care documented in the resident's note.  

## 2018-05-04 ENCOUNTER — Encounter: Payer: Self-pay | Admitting: *Deleted

## 2018-05-17 ENCOUNTER — Ambulatory Visit (INDEPENDENT_AMBULATORY_CARE_PROVIDER_SITE_OTHER): Payer: Medicaid Other

## 2018-05-17 ENCOUNTER — Ambulatory Visit (INDEPENDENT_AMBULATORY_CARE_PROVIDER_SITE_OTHER): Payer: Medicaid Other | Admitting: Orthopaedic Surgery

## 2018-05-17 ENCOUNTER — Encounter (INDEPENDENT_AMBULATORY_CARE_PROVIDER_SITE_OTHER): Payer: Self-pay | Admitting: Orthopaedic Surgery

## 2018-05-17 DIAGNOSIS — M79641 Pain in right hand: Secondary | ICD-10-CM | POA: Diagnosis not present

## 2018-05-17 DIAGNOSIS — M79642 Pain in left hand: Secondary | ICD-10-CM

## 2018-05-17 NOTE — Progress Notes (Signed)
Office Visit Note   Patient: Crystal Salazar           Date of Birth: [birth year]           MRN: [id number] Visit Date: 05/17/2018              Requested by: Toney Rakes, MD [address] [place], [place] [address] PCP: Toney Rakes, MD   Assessment & Plan: Visit Diagnoses:  1. Bilateral hand pain     Plan: Impression is bilateral carpal tunnel syndrome right greater than left.  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  At this point, we will repeat bilateral upper extremity nerve conduction studies.  She will follow-up with [place] once that is complete.  Call with concerns or questions in the meantime.  Follow-Up Instructions: Return in about 2 weeks (around 05/31/2018) for discuss nerve conduction studies.   Orders:  Orders Placed This Encounter  Procedures  . XR Hand Complete Left  . XR Hand Complete Right  . Ambulatory referral to Physical Medicine Rehab   No orders of the defined types were placed in this encounter.     Procedures: No procedures performed   Clinical Data: No additional findings.   Subjective: Chief Complaint  Patient presents with  . Right Wrist - Pain    HPI patient is a pleasant 42 year old female who presents to our clinic today with bilateral wrist pain right greater than left.  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  She is right-handed and does a lot of repetitive motion.  She is having pain to the [facility] joint as well as marked numbness to the thumb, index and long fingers.  She has noticed a fair amount of weakness to the right hand and has started dropping things.  She does note a previous history of CMC arthritis to the right thumb and has had this injected with cortisone in the past.  This helped for approximately 3 months.  The numbness does appear to be worse than the pain to the thumb at this point.  She has tried night  splints with minimal relief.  She does have a previous history of a nerve conduction study from 2016 which was normal.  Review of Systems as detailed in HPI.  All others reviewed and are negative.   Objective: Vital Signs: There were no vitals taken for this visit.  Physical Exam well-developed and well-nourished female in no acute distress.  Alert and oriented x3.  Ortho Exam examination of the right hand reveals minimal thenar atrophy.  Normal sensation.  Positive Phalen, negative Tinel.  Decreased grip strength.  Examination of the left hand shows no thenar atrophy.  Negative Phalen and negative Tinel.  Specialty Comments:  No specialty comments available.  Imaging: Xr Hand Complete Left  Result Date: 05/17/2018 Moderate first [facility] osteoarthritis  Xr Hand Complete Right  Result Date: 05/17/2018 Marked first CMC osteoarthritis    PMFS History: Patient Active Problem List   Diagnosis Date Noted  . Bilateral carpal tunnel syndrome 04/28/2018  . GERD (gastroesophageal reflux disease) 04/28/2018  . BMI 35.0-35.9,adult 04/28/2018  . IUD (intrauterine device) in place 04/28/2018  . Preventative health care 09/07/2016  . Vaginal discharge 09/07/2016  . Headache 09/07/2016  . Dizziness and giddiness 09/07/2016  . Stress incontinence 03/03/2016  . 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 Achilles tendinitis of right lower extremity 03/03/2016  History reviewed. No pertinent past medical history.  Family History  Problem Relation Age of Onset  . Hypertension Mother   . Diabetes Mellitus I Father   . Diabetes Father   . Hypertension Father   . Cancer Paternal Grandmother   . Cancer Paternal Grandfather   . Breast cancer Neg Hx     Past Surgical History:  Procedure Laterality Date  . ECTOPIC PREGNANCY SURGERY    . ESOPHAGEAL DILATION     Social History   Occupational History  . Not on file  Tobacco Use  . Smoking status: Former Smoker    Last attempt to quit:  07/05/2015    Years since quitting: 2.8  . Smokeless tobacco: Never Used  Substance and Sexual Activity  . Alcohol use: Yes    Alcohol/week: 0.0 oz    Comment: occ  . Drug use: No  . Sexual activity: Yes    Birth control/protection: IUD

## 2018-05-20 ENCOUNTER — Telehealth: Payer: Self-pay | Admitting: *Deleted

## 2018-05-20 NOTE — Telephone Encounter (Signed)
CALLED PATIENT LVM FOR HER TO CALL [facility] TO GET HER REFERRAL APPOINTMENT MADE([phone]) Crystal Salazar. THEY HAVE TRIED X 3 TO CONTACT HER. ASKED TO CONTACT OUR OFFICE IF SHE DECIDES SHE DO NOT WANT THIS REFERRAL ANY LONGER.

## 2018-06-15 ENCOUNTER — Encounter (INDEPENDENT_AMBULATORY_CARE_PROVIDER_SITE_OTHER): Payer: Self-pay | Admitting: Physical Medicine and Rehabilitation

## 2018-06-17 ENCOUNTER — Ambulatory Visit (INDEPENDENT_AMBULATORY_CARE_PROVIDER_SITE_OTHER): Payer: Medicaid Other | Admitting: Physical Medicine and Rehabilitation

## 2018-06-17 ENCOUNTER — Encounter (INDEPENDENT_AMBULATORY_CARE_PROVIDER_SITE_OTHER): Payer: Self-pay | Admitting: Physical Medicine and Rehabilitation

## 2018-06-17 DIAGNOSIS — R202 Paresthesia of skin: Secondary | ICD-10-CM

## 2018-06-17 NOTE — Progress Notes (Signed)
.  Numeric Pain Rating Scale and Functional Assessment Average Pain 10   In the last MONTH (on 0-10 scale) has pain interfered with the following?  1. General activity like being  able to carry out your everyday physical activities such as walking, climbing stairs, carrying groceries, or moving a chair?  Rating(8)    

## 2018-06-20 NOTE — Procedures (Signed)
EMG & NCV Findings: Evaluation of the left median (across palm) sensory nerve showed prolonged distal peak latency (Wrist, 4.3 ms).  The right median (across palm) sensory nerve showed prolonged distal peak latency (Wrist, 4.6 ms) and prolonged distal peak latency (Palm, 3.2 ms).  All remaining nerves (as indicated in the following tables) were within normal limits.  Left vs. Right side comparison data for the ulnar motor nerve indicates abnormal L-R amplitude difference (42.7 %).  All remaining left vs. right side differences were within normal limits.    All examined muscles (as indicated in the following table) showed no evidence of electrical instability.    Impression: The above electrodiagnostic study is ABNORMAL and reveals evidence of:  1.  A mild to moderate right median nerve entrapment at the wrist (carpal tunnel syndrome) affecting sensory components.   2.  A mild left median nerve entrapment at the wrist (carpal tunnel syndrome) affecting sensory components.   **This represents worsening from the prior electrodiagnostic study in 2016 which was read as a normal electrodiagnostic study at that time.   There is no significant electrodiagnostic evidence of any other focal nerve entrapment, brachial plexopathy or cervical radiculopathy.   Recommendations: 1.  Follow-up with referring physician. 2.  Continue current management of symptoms. 3.  Continue use of resting splint at night-time and as needed during the day. Consider surgical or injection treatment if felt appropriate.  ___________________________ Crystal Salazar FAAPMR Board Certified, American Board of Physical Medicine and Rehabilitation    Nerve Conduction Studies Anti Sensory Summary Table   Stim Site NR Peak (ms) Norm Peak (ms) P-T Amp (V) Norm P-T Amp Site1 Site2 Delta-P (ms) Dist (cm) Vel (m/s) Norm Vel (m/s)  Left Median Acr Palm Anti Sensory (2nd Digit)  33.4C  Wrist    *4.3 <3.6 21.3 >10 Wrist Palm 2.4 0.0      Palm    1.9 <2.0 0.5         Right Median Acr Palm Anti Sensory (2nd Digit)  33C  Wrist    *4.6 <3.6 19.4 >10 Wrist Palm 1.4 0.0    Palm    *3.2 <2.0 3.1         Left Radial Anti Sensory (Base 1st Digit)  33.1C  Wrist    2.1 <3.1 44.8  Wrist Base 1st Digit 2.1 0.0    Right Radial Anti Sensory (Base 1st Digit)  32.7C  Wrist    2.1 <3.1 19.1  Wrist Base 1st Digit 2.1 0.0    Left Ulnar Anti Sensory (5th Digit)  33.6C  Wrist    3.0 <3.7 31.5 >15.0 Wrist 5th Digit 3.0 14.0 47 >38  Right Ulnar Anti Sensory (5th Digit)  33C  Wrist    2.9 <3.7 25.3 >15.0 Wrist 5th Digit 2.9 14.0 48 >38   Motor Summary Table   Stim Site NR Onset (ms) Norm Onset (ms) O-P Amp (mV) Norm O-P Amp Site1 Site2 Delta-0 (ms) Dist (cm) Vel (m/s) Norm Vel (m/s)  Left Median Motor (Abd Poll Brev)  33.1C  Wrist    3.9 <4.2 10.4 >5 Elbow Wrist 3.7 20.0 54 >50  Elbow    7.6  10.5         Right Median Motor (Abd Poll Brev)  32.5C  Wrist    4.1 <4.2 8.0 >5 Elbow Wrist 3.9 20.0 51 >50  Elbow    8.0  7.5         Left Ulnar Motor (Abd Dig Min)  33.4C  Wrist    2.7 <4.2 5.9 >3 B Elbow Wrist 2.8 19.0 68 >53  B Elbow    5.5  8.0  A Elbow B Elbow 1.1 9.0 82 >53  A Elbow    6.6  8.2         Right Ulnar Motor (Abd Dig Min)  32.7C  Wrist    2.5 <4.2 10.3 >3 B Elbow Wrist 3.1 20.0 65 >53  B Elbow    5.6  10.6  A Elbow B Elbow 1.1 9.0 82 >53  A Elbow    6.7  10.1          EMG   Side Muscle Nerve Root Ins Act Fibs Psw Amp Dur Poly Recrt Int Dennie Bible Comment  Right Abd Poll Brev Median C8-T1 Nml Nml Nml Nml Nml 0 Nml Nml   Right 1stDorInt Ulnar C8-T1 Nml Nml Nml Nml Nml 0 Nml Nml   Right PronatorTeres Median C6-7 Nml Nml Nml Nml Nml 0 Nml Nml   Right Biceps Musculocut C5-6 Nml Nml Nml Nml Nml 0 Nml Nml   Right Deltoid Axillary C5-6 Nml Nml Nml Nml Nml 0 Nml Nml     Nerve Conduction Studies Anti Sensory Left/Right Comparison   Stim Site L Lat (ms) R Lat (ms) L-R Lat (ms) L Amp (V) R Amp (V) L-R Amp (%) Site1 Site2 L Vel  (m/s) R Vel (m/s) L-R Vel (m/s)  Median Acr Palm Anti Sensory (2nd Digit)  33.4C  Wrist *4.3 *4.6 0.3 21.3 19.4 8.9 Wrist Palm     Palm 1.9 *3.2 1.3 0.5 3.1 83.9       Radial Anti Sensory (Base 1st Digit)  33.1C  Wrist 2.1 2.1 0.0 44.8 19.1 57.4 Wrist Base 1st Digit     Ulnar Anti Sensory (5th Digit)  33.6C  Wrist 3.0 2.9 0.1 31.5 25.3 19.7 Wrist 5th Digit 47 48 1   Motor Left/Right Comparison   Stim Site L Lat (ms) R Lat (ms) L-R Lat (ms) L Amp (mV) R Amp (mV) L-R Amp (%) Site1 Site2 L Vel (m/s) R Vel (m/s) L-R Vel (m/s)  Median Motor (Abd Poll Brev)  33.1C  Wrist 3.9 4.1 0.2 10.4 8.0 23.1 Elbow Wrist 54 51 3  Elbow 7.6 8.0 0.4 10.5 7.5 28.6       Ulnar Motor (Abd Dig Min)  33.4C  Wrist 2.7 2.5 0.2 5.9 10.3 *42.7 B Elbow Wrist 68 65 3  B Elbow 5.5 5.6 0.1 8.0 10.6 24.5 A Elbow B Elbow 82 82 0  A Elbow 6.6 6.7 0.1 8.2 10.1 18.8          Waveforms:

## 2018-06-20 NOTE — Progress Notes (Signed)
Crystal Salazar - 42 y.o. female MRN [id number]  Date of birth: Jan 09, 1976  Office Visit Note: Visit Date: 06/17/2018 PCP: Toney Rakes, MD Referred by: Toney Rakes, MD  Subjective: Chief Complaint  Patient presents with  . Right Hand - Pain, Numbness, Tingling  . Right Forearm - Numbness, Pain, Tingling   HPI: Crystal Salazar  is a 42 year old right-hand-dominant female that comes in today at the request of Dr. Glee Arvin for electrodiagnostic study of the right upper limb.  She reports worsening chronic pain numbness and tingling in the right forearm and right hand mostly in the radial digits.  She reports symptoms started approximately 2 years ago with insidious onset with progressive worsening.  She does not endorse specific trauma or frank radicular pain.  She reports symptoms can come and go and she does not note that anything necessarily makes it worse.  She does get nocturnal complaints.  She does report that changing the position of the arm and using the brace at night does seem to help.  She rates her pain as a 10 out of 10.  She does endorse some left-sided complaints although mild.  She has had prior electrodiagnostic studies in 2016 that were conducted in this office by myself.  These are reviewed below and were essentially normal.   ROS Otherwise per HPI.  Assessment & Plan: Visit Diagnoses:  1. Paresthesia of skin     Plan: No additional findings.  Impression: The above electrodiagnostic study is ABNORMAL and reveals evidence of:  1.  A mild to moderate right median nerve entrapment at the wrist (carpal tunnel syndrome) affecting sensory components.   2.  A mild left median nerve entrapment at the wrist (carpal tunnel syndrome) affecting sensory components.   **This represents worsening from the prior electrodiagnostic study in 2016 which was read as a normal electrodiagnostic study at that time.   There is no significant electrodiagnostic evidence of any  other focal nerve entrapment, brachial plexopathy or cervical radiculopathy.   Recommendations: 1.  Follow-up with referring physician. 2.  Continue current management of symptoms. 3.  Continue use of resting splint at night-time and as needed during the day. Consider surgical or injection treatment if felt appropriate.  Meds & Orders: No orders of the defined types were placed in this encounter.   Orders Placed This Encounter  Procedures  . NCV with EMG (electromyography)    Follow-up: Return for Dr. Glee Arvin.   Procedures: No procedures performed  EMG & NCV Findings: Evaluation of the left median (across palm) sensory nerve showed prolonged distal peak latency (Wrist, 4.3 ms).  The right median (across palm) sensory nerve showed prolonged distal peak latency (Wrist, 4.6 ms) and prolonged distal peak latency (Palm, 3.2 ms).  All remaining nerves (as indicated in the following tables) were within normal limits.  Left vs. Right side comparison data for the ulnar motor nerve indicates abnormal L-R amplitude difference (42.7 %).  All remaining left vs. right side differences were within normal limits.    All examined muscles (as indicated in the following table) showed no evidence of electrical instability.    Impression: The above electrodiagnostic study is ABNORMAL and reveals evidence of:  1.  A mild to moderate right median nerve entrapment at the wrist (carpal tunnel syndrome) affecting sensory components.   2.  A mild left median nerve entrapment at the wrist (carpal tunnel syndrome) affecting sensory components.   **This represents worsening from the prior electrodiagnostic study in  2016 which was read as a normal electrodiagnostic study at that time.   There is no significant electrodiagnostic evidence of any other focal nerve entrapment, brachial plexopathy or cervical radiculopathy.   Recommendations: 1.  Follow-up with referring physician. 2.  Continue current management  of symptoms. 3.  Continue use of resting splint at night-time and as needed during the day. Consider surgical or injection treatment if felt appropriate.  ___________________________ Elease Hashimoto Board Certified, American Board of Physical Medicine and Rehabilitation    Nerve Conduction Studies Anti Sensory Summary Table   Stim Site NR Peak (ms) Norm Peak (ms) P-T Amp (V) Norm P-T Amp Site1 Site2 Delta-P (ms) Dist (cm) Vel (m/s) Norm Vel (m/s)  Left Median Acr Palm Anti Sensory (2nd Digit)  33.4C  Wrist    *4.3 <3.6 21.3 >10 Wrist Palm 2.4 0.0    Palm    1.9 <2.0 0.5         Right Median Acr Palm Anti Sensory (2nd Digit)  33C  Wrist    *4.6 <3.6 19.4 >10 Wrist Palm 1.4 0.0    Palm    *3.2 <2.0 3.1         Left Radial Anti Sensory (Base 1st Digit)  33.1C  Wrist    2.1 <3.1 44.8  Wrist Base 1st Digit 2.1 0.0    Right Radial Anti Sensory (Base 1st Digit)  32.7C  Wrist    2.1 <3.1 19.1  Wrist Base 1st Digit 2.1 0.0    Left Ulnar Anti Sensory (5th Digit)  33.6C  Wrist    3.0 <3.7 31.5 >15.0 Wrist 5th Digit 3.0 14.0 47 >38  Right Ulnar Anti Sensory (5th Digit)  33C  Wrist    2.9 <3.7 25.3 >15.0 Wrist 5th Digit 2.9 14.0 48 >38   Motor Summary Table   Stim Site NR Onset (ms) Norm Onset (ms) O-P Amp (mV) Norm O-P Amp Site1 Site2 Delta-0 (ms) Dist (cm) Vel (m/s) Norm Vel (m/s)  Left Median Motor (Abd Poll Brev)  33.1C  Wrist    3.9 <4.2 10.4 >5 Elbow Wrist 3.7 20.0 54 >50  Elbow    7.6  10.5         Right Median Motor (Abd Poll Brev)  32.5C  Wrist    4.1 <4.2 8.0 >5 Elbow Wrist 3.9 20.0 51 >50  Elbow    8.0  7.5         Left Ulnar Motor (Abd Dig Min)  33.4C  Wrist    2.7 <4.2 5.9 >3 B Elbow Wrist 2.8 19.0 68 >53  B Elbow    5.5  8.0  A Elbow B Elbow 1.1 9.0 82 >53  A Elbow    6.6  8.2         Right Ulnar Motor (Abd Dig Min)  32.7C  Wrist    2.5 <4.2 10.3 >3 B Elbow Wrist 3.1 20.0 65 >53  B Elbow    5.6  10.6  A Elbow B Elbow 1.1 9.0 82 >53  A Elbow    6.7  10.1           EMG   Side Muscle Nerve Root Ins Act Fibs Psw Amp Dur Poly Recrt Int Dennie Bible Comment  Right Abd Poll Brev Median C8-T1 Nml Nml Nml Nml Nml 0 Nml Nml   Right 1stDorInt Ulnar C8-T1 Nml Nml Nml Nml Nml 0 Nml Nml   Right PronatorTeres Median C6-7 Nml Nml Nml Nml Nml 0 Nml Nml  Right Biceps Musculocut C5-6 Nml Nml Nml Nml Nml 0 Nml Nml   Right Deltoid Axillary C5-6 Nml Nml Nml Nml Nml 0 Nml Nml     Nerve Conduction Studies Anti Sensory Left/Right Comparison   Stim Site L Lat (ms) R Lat (ms) L-R Lat (ms) L Amp (V) R Amp (V) L-R Amp (%) Site1 Site2 L Vel (m/s) R Vel (m/s) L-R Vel (m/s)  Median Acr Palm Anti Sensory (2nd Digit)  33.4C  Wrist *4.3 *4.6 0.3 21.3 19.4 8.9 Wrist Palm     Palm 1.9 *3.2 1.3 0.5 3.1 83.9       Radial Anti Sensory (Base 1st Digit)  33.1C  Wrist 2.1 2.1 0.0 44.8 19.1 57.4 Wrist Base 1st Digit     Ulnar Anti Sensory (5th Digit)  33.6C  Wrist 3.0 2.9 0.1 31.5 25.3 19.7 Wrist 5th Digit 47 48 1   Motor Left/Right Comparison   Stim Site L Lat (ms) R Lat (ms) L-R Lat (ms) L Amp (mV) R Amp (mV) L-R Amp (%) Site1 Site2 L Vel (m/s) R Vel (m/s) L-R Vel (m/s)  Median Motor (Abd Poll Brev)  33.1C  Wrist 3.9 4.1 0.2 10.4 8.0 23.1 Elbow Wrist 54 51 3  Elbow 7.6 8.0 0.4 10.5 7.5 28.6       Ulnar Motor (Abd Dig Min)  33.4C  Wrist 2.7 2.5 0.2 5.9 10.3 *42.7 B Elbow Wrist 68 65 3  B Elbow 5.5 5.6 0.1 8.0 10.6 24.5 A Elbow B Elbow 82 82 0  A Elbow 6.6 6.7 0.1 8.2 10.1 18.8          Waveforms:                     Clinical History: Electrodiagnostic study 04/19/2015  Impression: Essentially NORMAL electrodiagnostic study of both upper limbs.  There is no significant electrodiagnostic evidence of nerve entrapment, brachial plexopathy or.    As you know, purely sensory or demyelinating radiculopathies and chemical radiculitis may not be detected with this particular electrodiagnostic study.  Recommendations: 1.  Follow-up with referring physician. 2.   Continue current management of symptoms.  Continue evaluation for more musculotendinous problem such as osteoarthritis or de Quervain's et Karie Soda.     ___________________________ Johnnette Barrios. Alvester Morin, M.D. FAAPMR   She reports that she quit smoking about 2 years ago. She has never used smokeless tobacco. No results for input(s): HGBA1C, LABURIC in the last 8760 hours.  Objective:  VS:  HT:    WT:   BMI:     BP:   HR: bpm  TEMP: ( )  RESP:  Physical Exam  Musculoskeletal:  Inspection reveals no atrophy of the bilateral APB or FDI or hand intrinsics. There is no swelling, color changes, allodynia or dystrophic changes. There is 5 out of 5 strength in the bilateral wrist extension, finger abduction and long finger flexion. There is intact sensation to light touch in all dermatomal and peripheral nerve distributions.  There is a positive Phalen's test on the right. There is a negative Hoffmann's test bilaterally.    Ortho Exam Imaging: No results found.  Past Medical/Family/Surgical/Social History: Medications & Allergies reviewed per EMR, new medications updated. Patient Active Problem List   Diagnosis Date Noted  . Bilateral carpal tunnel syndrome 04/28/2018  . GERD (gastroesophageal reflux disease) 04/28/2018  . BMI 35.0-35.9,adult 04/28/2018  . IUD (intrauterine device) in place 04/28/2018  . Preventative health care 09/07/2016  . Vaginal discharge 09/07/2016  . Headache  09/07/2016  . Dizziness and giddiness 09/07/2016  . Stress incontinence 03/03/2016  . 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 Achilles tendinitis of right lower extremity 03/03/2016   History reviewed. No pertinent past medical history. Family History  Problem Relation Age of Onset  . Hypertension Mother   . Diabetes Mellitus I Father   . Diabetes Father   . Hypertension Father   . Cancer Paternal Grandmother   . Cancer Paternal Grandfather   . Breast cancer Neg Hx    Past Surgical History:  Procedure  Laterality Date  . ECTOPIC PREGNANCY SURGERY    . ESOPHAGEAL DILATION     Social History   Occupational History  . Not on file  Tobacco Use  . Smoking status: Former Smoker    Last attempt to quit: 07/05/2015    Years since quitting: 2.9  . Smokeless tobacco: Never Used  Substance and Sexual Activity  . Alcohol use: Yes    Alcohol/week: 0.0 oz    Comment: occ  . Drug use: No  . Sexual activity: Yes    Birth control/protection: IUD

## 2018-06-21 ENCOUNTER — Encounter: Payer: Self-pay | Admitting: Internal Medicine

## 2018-06-21 NOTE — Progress Notes (Signed)
I sent letter with results and need for pt to call ortho (ortho never able to reach pt) to sch an appt

## 2018-06-23 ENCOUNTER — Ambulatory Visit (INDEPENDENT_AMBULATORY_CARE_PROVIDER_SITE_OTHER): Payer: Medicaid Other | Admitting: Orthopaedic Surgery

## 2018-06-23 DIAGNOSIS — G5601 Carpal tunnel syndrome, right upper limb: Secondary | ICD-10-CM

## 2018-06-23 DIAGNOSIS — G5602 Carpal tunnel syndrome, left upper limb: Secondary | ICD-10-CM

## 2018-06-23 MED ORDER — METHYLPREDNISOLONE ACETATE 40 MG/ML IJ SUSP
40.0000 mg | INTRAMUSCULAR | Status: AC | PRN
  Administered 2018-06-23: 40 mg

## 2018-06-23 MED ORDER — LIDOCAINE HCL 1 % IJ SOLN
1.0000 mL | INTRAMUSCULAR | Status: AC | PRN
  Administered 2018-06-23: 1 mL

## 2018-06-23 MED ORDER — BUPIVACAINE HCL 0.5 % IJ SOLN
1.0000 mL | INTRAMUSCULAR | Status: AC | PRN
  Administered 2018-06-23: 1 mL

## 2018-06-23 NOTE — Progress Notes (Signed)
   Office Visit Note   Patient: Crystal Salazar           Date of Birth: 11/28/1976           MRN: [id number] Visit Date: 06/23/2018              Requested by: Toney RakesLacroce, Samantha J, MD [address] [place], [place] [id number] PCP: Versie StarksSeawell, Jaimie A, DO   Assessment & Plan: Visit Diagnoses:  1. Right carpal tunnel syndrome   2. Left carpal tunnel syndrome     Plan: Impression is mild to moderate right carpal tunnel syndrome and mild left carpal tunnel syndrome.  Bilateral carpal tunnel injection today.  Additional treatment options including surgical release and the risks and benefits.  Patient underwent injections uneventfully.  Follow-up as needed.  Follow-Up Instructions: Return if symptoms worsen or fail to improve.   Orders:  No orders of the defined types were placed in this encounter.  No orders of the defined types were placed in this encounter.     Procedures: Hand/UE Inj: bilateral carpal tunnel for carpal tunnel syndrome on 06/23/2018 11:28 AM Indications: pain Details: 25 G needle Medications (Right): 1 mL lidocaine 1 %; 1 mL bupivacaine 0.5 %; 40 mg methylPREDNISolone acetate 40 MG/ML Medications (Left): 1 mL lidocaine 1 %; 1 mL bupivacaine 0.5 %; 40 mg methylPREDNISolone acetate 40 MG/ML Outcome: tolerated well, no immediate complications Patient was prepped and draped in the usual sterile fashion.       Clinical Data: No additional findings.   Subjective: Chief Complaint  Patient presents with  . Left Hand - Follow-up    Post NCS/EMG  . Right Hand - Follow-up    Post NCS/EMG    Patient comes in for review of conduction studies.   Review of Systems   Objective: Vital Signs: There were no vitals taken for this visit.  Physical Exam  Ortho Exam Stable exam. Specialty Comments:  No specialty comments available.  Imaging: No results found.   PMFS History: Patient Active Problem List   Diagnosis Date Noted  . Bilateral carpal tunnel syndrome  04/28/2018  . GERD (gastroesophageal reflux disease) 04/28/2018  . BMI 35.0-35.9,adult 04/28/2018  . IUD (intrauterine device) in place 04/28/2018  . Preventative health care 09/07/2016  . Vaginal discharge 09/07/2016  . Headache 09/07/2016  . Dizziness and giddiness 09/07/2016  . Stress incontinence 03/03/2016  . 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 Achilles tendinitis of right lower extremity 03/03/2016   No past medical history on file.  Family History  Problem Relation Age of Onset  . Hypertension Mother   . Diabetes Mellitus I Father   . Diabetes Father   . Hypertension Father   . Cancer Paternal Grandmother   . Cancer Paternal Grandfather   . Breast cancer Neg Hx     Past Surgical History:  Procedure Laterality Date  . ECTOPIC PREGNANCY SURGERY    . ESOPHAGEAL DILATION     Social History   Occupational History  . Not on file  Tobacco Use  . Smoking status: Former Smoker    Last attempt to quit: 07/05/2015    Years since quitting: 2.9  . Smokeless tobacco: Never Used  Substance and Sexual Activity  . Alcohol use: Yes    Alcohol/week: 0.0 oz    Comment: occ  . Drug use: No  . Sexual activity: Yes    Birth control/protection: IUD

## 2018-07-18 ENCOUNTER — Encounter: Payer: Self-pay | Admitting: *Deleted

## 2018-09-08 ENCOUNTER — Ambulatory Visit (INDEPENDENT_AMBULATORY_CARE_PROVIDER_SITE_OTHER): Payer: Medicaid Other | Admitting: Obstetrics and Gynecology

## 2018-09-08 ENCOUNTER — Encounter: Payer: Self-pay | Admitting: Obstetrics and Gynecology

## 2018-09-08 ENCOUNTER — Other Ambulatory Visit (HOSPITAL_COMMUNITY)
Admission: RE | Admit: 2018-09-08 | Discharge: 2018-09-08 | Disposition: A | Discharge status: Home / Self Care | Payer: Medicaid Other | Source: Ambulatory Visit | Attending: Obstetrics and Gynecology | Admitting: Obstetrics and Gynecology

## 2018-09-08 VITALS — BP 119/76 | HR 67 | Ht 62.25 in | Wt 186.4 lb

## 2018-09-08 DIAGNOSIS — B9689 Other specified bacterial agents as the cause of diseases classified elsewhere: Secondary | ICD-10-CM | POA: Diagnosis not present

## 2018-09-08 DIAGNOSIS — Z01419 Encounter for gynecological examination (general) (routine) without abnormal findings: Secondary | ICD-10-CM

## 2018-09-08 DIAGNOSIS — Z0000 Encounter for general adult medical examination without abnormal findings: Secondary | ICD-10-CM

## 2018-09-08 NOTE — Progress Notes (Signed)
Subjective:     Crystal Salazar is a 42 y.o. female P4 with BMI 33 who is here for a comprehensive physical exam. The patient reports no problems. Patient is sexually active using Mirena for contraception. She reports a monthly period of 4-5 days. She denies any pelvic pain or abnormal discharge. She reports occasional leakage of urine with valsalva  No past medical history on file. Past Surgical History:  Procedure Laterality Date  . ECTOPIC PREGNANCY SURGERY    . ESOPHAGEAL DILATION     Family History  Problem Relation Age of Onset  . Hypertension Mother   . Diabetes Mellitus I Father   . Diabetes Father   . Hypertension Father   . Cancer Paternal Grandmother   . Cancer Paternal Grandfather   . Breast cancer Neg Hx     Social History   Socioeconomic History  . Marital status: Married    Spouse name: Not on file  . Number of children: Not on file  . Years of education: Not on file  . Highest education level: Not on file  Occupational History  . Not on file  Social Needs  . Financial resource strain: Not on file  . Food insecurity:    Worry: Not on file    Inability: Not on file  . Transportation needs:    Medical: Not on file    Non-medical: Not on file  Tobacco Use  . Smoking status: Former Smoker    Last attempt to quit: 07/05/2015    Years since quitting: 3.1  . Smokeless tobacco: Never Used  Substance and Sexual Activity  . Alcohol use: Yes    Alcohol/week: 0.0 standard drinks    Comment: occ  . Drug use: No  . Sexual activity: Yes    Birth control/protection: IUD  Lifestyle  . Physical activity:    Days per week: Not on file    Minutes per session: Not on file  . Stress: Not on file  Relationships  . Social connections:    Talks on phone: Not on file    Gets together: Not on file    Attends religious service: Not on file    Active member of club or organization: Not on file    Attends meetings of clubs or organizations: Not on file    Relationship  status: Not on file  . Intimate partner violence:    Fear of current or ex partner: Not on file    Emotionally abused: Not on file    Physically abused: Not on file    Forced sexual activity: Not on file  Other Topics Concern  . Not on file  Social History Narrative  . Not on file   Health Maintenance  Topic Date Due  . PAP SMEAR  04/20/2020  . TETANUS/TDAP  04/27/2028  . INFLUENZA VACCINE  Completed  . HIV Screening  Completed       Review of Systems Pertinent items are noted in HPI.   Objective:      GENERAL: Well-developed, well-nourished female in no acute distress.  HEENT: Normocephalic, atraumatic. Sclerae anicteric.  NECK: Supple. Normal thyroid.  LUNGS: Clear to auscultation bilaterally.  HEART: Regular rate and rhythm. BREASTS: Symmetric in size. No palpable masses or lymphadenopathy, skin changes, or nipple drainage. ABDOMEN: Soft, nontender, nondistended. No organomegaly. PELVIC: Normal external female genitalia. Vagina is pink and rugated.  Normal discharge. Normal appearing cervix with IUD strings visualized. Uterus is normal in size. No adnexal mass or tenderness. EXTREMITIES: No  cyanosis, clubbing, or edema, 2+ distal pulses.    Assessment:    Healthy female exam.      Plan:    Pap smear collected STI screen performed per patient request Health maintenance labs ordered Patient will be contacted with abnormal results Screening mammogram ordered Patient states at the end of the visit that IUD needs to be replaced. Patient will return for IUD removal and re-insertion See After Visit Summary for Counseling Recommendations

## 2018-09-08 NOTE — Progress Notes (Signed)
Patient is in the office for annual, last pap 04-24-18. Pt desires std testing. Pt currently has IUD.

## 2018-09-09 LAB — COMPREHENSIVE METABOLIC PANEL
ALBUMIN: 4.3 g/dL (ref 3.5–5.5)
ALK PHOS: 45 IU/L (ref 39–117)
ALT: 10 IU/L (ref 0–32)
AST: 16 IU/L (ref 0–40)
Albumin/Globulin Ratio: 1.7 (ref 1.2–2.2)
BILIRUBIN TOTAL: 1.8 mg/dL — AB (ref 0.0–1.2)
BUN / CREAT RATIO: 10 (ref 9–23)
BUN: 10 mg/dL (ref 6–24)
CHLORIDE: 101 mmol/L (ref 96–106)
CO2: 23 mmol/L (ref 20–29)
CREATININE: 1 mg/dL (ref 0.57–1.00)
Calcium: 9 mg/dL (ref 8.7–10.2)
GFR calc Af Amer: 79 mL/min/{1.73_m2} (ref >59)
GFR calc non Af Amer: 69 mL/min/{1.73_m2} (ref >59)
GLUCOSE: 66 mg/dL (ref 65–99)
Globulin, Total: 2.5 g/dL (ref 1.5–4.5)
Potassium: 3.8 mmol/L (ref 3.5–5.2)
Sodium: 137 mmol/L (ref 134–144)
Total Protein: 6.8 g/dL (ref 6.0–8.5)

## 2018-09-09 LAB — CBC
HEMATOCRIT: 36.9 % (ref 34.0–46.6)
Hemoglobin: 12.4 g/dL (ref 11.1–15.9)
MCH: 32.5 pg (ref 26.6–33.0)
MCHC: 33.6 g/dL (ref 31.5–35.7)
MCV: 97 fL (ref 79–97)
Platelets: 297 10*3/uL (ref 150–450)
RBC: 3.82 x10E6/uL (ref 3.77–5.28)
RDW: 13.1 % (ref 12.3–15.4)
WBC: 5 10*3/uL (ref 3.4–10.8)

## 2018-09-09 LAB — RPR: RPR Ser Ql: NONREACTIVE

## 2018-09-09 LAB — HEPATITIS C ANTIBODY: Hep C Virus Ab: <0.1 s/co ratio (ref 0.0–0.9)

## 2018-09-09 LAB — HEMOGLOBIN A1C
Est. average glucose Bld gHb Est-mCnc: 103 mg/dL
HEMOGLOBIN A1C: 5.2 % (ref 4.8–5.6)

## 2018-09-09 LAB — HEPATITIS B SURFACE ANTIGEN: HEP B S AG: NEGATIVE

## 2018-09-09 LAB — HIV ANTIBODY (ROUTINE TESTING W REFLEX): HIV Screen 4th Generation wRfx: NONREACTIVE

## 2018-09-09 LAB — TSH: TSH: 0.606 u[IU]/mL (ref 0.450–4.500)

## 2018-09-12 LAB — CYTOLOGY - PAP
ADEQUACY: ABSENT
BACTERIAL VAGINITIS: POSITIVE — AB
CANDIDA VAGINITIS: NEGATIVE
CHLAMYDIA, DNA PROBE: NEGATIVE
Diagnosis: NEGATIVE
HPV (WINDOPATH): NOT DETECTED
NEISSERIA GONORRHEA: NEGATIVE
TRICH (WINDOWPATH): NEGATIVE

## 2018-09-12 MED ORDER — METRONIDAZOLE 500 MG PO TABS: 500.0000 mg | ORAL_TABLET | Freq: Two times a day (BID) | ORAL | 0 refills | Status: DC

## 2018-09-12 NOTE — Addendum Note (Signed)
Addended by: Catalina AntiguaONSTANT, Jaleiyah Alas on: 09/12/2018 03:54 PM   Modules accepted: Orders

## 2018-09-26 ENCOUNTER — Ambulatory Visit: Payer: Medicaid Other | Admitting: Obstetrics and Gynecology

## 2018-10-10 ENCOUNTER — Ambulatory Visit: Payer: Medicaid Other

## 2018-10-18 ENCOUNTER — Ambulatory Visit: Payer: Medicaid Other | Admitting: Obstetrics and Gynecology

## 2018-10-28 ENCOUNTER — Other Ambulatory Visit: Payer: Self-pay | Admitting: Obstetrics and Gynecology

## 2018-10-28 DIAGNOSIS — Z1239 Encounter for other screening for malignant neoplasm of breast: Secondary | ICD-10-CM

## 2018-12-12 ENCOUNTER — Ambulatory Visit: Payer: Medicaid Other

## 2019-01-09 ENCOUNTER — Encounter: Payer: Self-pay | Admitting: Obstetrics and Gynecology

## 2019-01-09 ENCOUNTER — Ambulatory Visit: Payer: Medicaid Other | Admitting: Obstetrics and Gynecology

## 2019-01-09 VITALS — BP 128/79 | HR 83 | Wt 186.1 lb

## 2019-01-09 DIAGNOSIS — Z3202 Encounter for pregnancy test, result negative: Secondary | ICD-10-CM | POA: Diagnosis not present

## 2019-01-09 DIAGNOSIS — Z30433 Encounter for removal and reinsertion of intrauterine contraceptive device: Secondary | ICD-10-CM | POA: Diagnosis not present

## 2019-01-09 LAB — POCT URINE PREGNANCY: Preg Test, Ur: NEGATIVE

## 2019-01-09 MED ORDER — LEVONORGESTREL 20 MCG/24HR IU IUD
INTRAUTERINE_SYSTEM | Freq: Once | INTRAUTERINE | Status: AC
  Administered 2019-01-09: 11:00:00 via INTRAUTERINE

## 2019-01-09 NOTE — Progress Notes (Signed)
Pt is here for IUD removal/reinsertion.

## 2019-01-09 NOTE — Progress Notes (Signed)
Patient here for IUD removal and reinsertion  GYNECOLOGY CLINIC PROCEDURE NOTE  Crystal Salazar is a 42 y.o. [id number] here for IUD removal. No GYN concerns.  Last pap smear was on 08/2018 and was normal. Urine pregnancy test negative.  IUD Removal  Patient identified, informed consent performed, consent signed.  Patient was in the dorsal lithotomy position, normal external genitalia was noted.  A speculum was placed in the patient's vagina, normal discharge was noted, no lesions. The cervix was visualized, no lesions, no abnormal discharge.  The strings of the IUD were grasped and pulled using ring forceps. The IUD was removed in its entirety.     IUD Insertion Cervix visualized and cleaned with Betadine x 2.  Grasped anteriorly with a single tooth tenaculum.  Uterus sounded to 8 cm.  Mirena IUD placed per manufacturer's recommendations.  Strings trimmed to 3 cm. Tenaculum was removed, good hemostasis noted.  Patient tolerated procedure well.   Patient given post procedure instructions and Mirena care card with expiration date.  Patient is asked to check IUD strings periodically and follow up in 4-6 weeks for IUD check.

## 2019-01-09 NOTE — Patient Instructions (Signed)

## 2019-02-09 ENCOUNTER — Ambulatory Visit: Payer: Medicaid Other | Admitting: Obstetrics and Gynecology

## 2019-02-15 ENCOUNTER — Encounter (INDEPENDENT_AMBULATORY_CARE_PROVIDER_SITE_OTHER): Payer: Self-pay | Admitting: Orthopaedic Surgery

## 2019-02-17 ENCOUNTER — Ambulatory Visit (INDEPENDENT_AMBULATORY_CARE_PROVIDER_SITE_OTHER): Payer: Medicaid Other | Admitting: Orthopaedic Surgery

## 2019-02-21 ENCOUNTER — Encounter (INDEPENDENT_AMBULATORY_CARE_PROVIDER_SITE_OTHER): Payer: Self-pay | Admitting: Orthopaedic Surgery

## 2019-03-01 ENCOUNTER — Ambulatory Visit: Payer: Medicaid Other | Admitting: Obstetrics and Gynecology

## 2019-03-01 ENCOUNTER — Encounter: Payer: Self-pay | Admitting: Obstetrics and Gynecology

## 2019-03-01 VITALS — BP 122/71 | HR 54 | Ht 62.0 in | Wt 181.9 lb

## 2019-03-01 DIAGNOSIS — Z30431 Encounter for routine checking of intrauterine contraceptive device: Secondary | ICD-10-CM | POA: Diagnosis not present

## 2019-03-01 NOTE — Progress Notes (Signed)
43 yo P4 here for IUD check. Patient had IUD inserted on 01/09/19. She reports doing well without complaints of pelvic pain, vaginal bleeding or discharge. She has been sexually active without complaints. She reports a normal period last month  No past medical history on file. Past Surgical History:  Procedure Laterality Date  . ECTOPIC PREGNANCY SURGERY    . ESOPHAGEAL DILATION     Family History  Problem Relation Age of Onset  . Hypertension Mother   . Diabetes Mellitus I Father   . Diabetes Father   . Hypertension Father   . Cancer Paternal Grandmother   . Cancer Paternal Grandfather   . Breast cancer Maternal Aunt    Social History   Tobacco Use  . Smoking status: Former Smoker    Last attempt to quit: 07/05/2015    Years since quitting: 3.6  . Smokeless tobacco: Never Used  Substance Use Topics  . Alcohol use: Yes    Alcohol/week: 0.0 standard drinks    Comment: occ  . Drug use: No   ROS See pertinent in HPI  Blood pressure 122/71, pulse (!) 54, height 5\' 2"  (1.575 m), weight 181 lb 14.4 oz (82.5 kg), last menstrual period 01/27/2019. GENERAL: Well-developed, well-nourished female in no acute distress.  ABDOMEN: Soft, nontender, nondistended. No organomegaly. PELVIC: Normal external female genitalia. Vagina is pink and rugated.  Normal discharge. Normal appearing cervix. IUD strings not visualized at the os. Uterus is normal in size.  No adnexal mass or tenderness. EXTREMITIES: No cyanosis, clubbing, or edema, 2+ distal pulses.  A/P 43 yo here for IUD check - IUD strings not visualized - Pelvic ultrasound ordered - patient will be contacted with results

## 2019-03-01 NOTE — Progress Notes (Signed)
Presents for IUD Check. Reports no problems today.

## 2019-03-08 ENCOUNTER — Other Ambulatory Visit: Payer: Self-pay

## 2019-03-08 ENCOUNTER — Ambulatory Visit (HOSPITAL_COMMUNITY)
Admission: RE | Admit: 2019-03-08 | Discharge: 2019-03-08 | Disposition: A | Discharge status: Home / Self Care | Payer: Medicaid Other | Source: Ambulatory Visit | Attending: Obstetrics and Gynecology | Admitting: Obstetrics and Gynecology

## 2019-03-08 DIAGNOSIS — T8332XA Displacement of intrauterine contraceptive device, initial encounter: Secondary | ICD-10-CM | POA: Diagnosis not present

## 2019-03-08 DIAGNOSIS — Z30431 Encounter for routine checking of intrauterine contraceptive device: Secondary | ICD-10-CM | POA: Diagnosis not present

## 2019-03-10 ENCOUNTER — Ambulatory Visit (INDEPENDENT_AMBULATORY_CARE_PROVIDER_SITE_OTHER): Payer: Medicaid Other | Admitting: Physician Assistant

## 2019-03-14 ENCOUNTER — Telehealth: Payer: Self-pay

## 2019-03-14 NOTE — Telephone Encounter (Signed)
S/w pt and advised that per provider, IUD is in a normal position in the uterus.

## 2019-03-17 ENCOUNTER — Other Ambulatory Visit: Payer: Self-pay

## 2019-03-17 ENCOUNTER — Ambulatory Visit (INDEPENDENT_AMBULATORY_CARE_PROVIDER_SITE_OTHER): Payer: Self-pay

## 2019-03-17 ENCOUNTER — Encounter (INDEPENDENT_AMBULATORY_CARE_PROVIDER_SITE_OTHER): Payer: Self-pay | Admitting: Orthopaedic Surgery

## 2019-03-17 ENCOUNTER — Ambulatory Visit (INDEPENDENT_AMBULATORY_CARE_PROVIDER_SITE_OTHER): Payer: Medicaid Other | Admitting: Orthopaedic Surgery

## 2019-03-17 DIAGNOSIS — G5601 Carpal tunnel syndrome, right upper limb: Secondary | ICD-10-CM

## 2019-03-17 DIAGNOSIS — G5602 Carpal tunnel syndrome, left upper limb: Secondary | ICD-10-CM

## 2019-03-17 MED ORDER — BUPIVACAINE HCL 0.5 % IJ SOLN
1.0000 mL | INTRAMUSCULAR | Status: AC | PRN
  Administered 2019-03-17: 1 mL

## 2019-03-17 MED ORDER — LIDOCAINE HCL 1 % IJ SOLN
1.0000 mL | INTRAMUSCULAR | Status: AC | PRN
  Administered 2019-03-17: 1 mL

## 2019-03-17 MED ORDER — METHYLPREDNISOLONE ACETATE 40 MG/ML IJ SUSP
40.0000 mg | INTRAMUSCULAR | Status: AC | PRN
  Administered 2019-03-17: 40 mg

## 2019-03-17 NOTE — Progress Notes (Signed)
   Office Visit Note   Patient: Crystal Salazar           Date of Birth: 16-Aug-1976           MRN: [id number] Visit Date: 03/17/2019              Requested by: Crystal Scarlet A, DO [address] [place], [place] [address] PCP: Crystal Starks, DO   Assessment & Plan: Visit Diagnoses:  1. Right carpal tunnel syndrome   2. Carpal tunnel syndrome on left     Plan: Bilateral carpal tunnel injections performed today.  Patient tolerated well.  Follow-up as needed.  Follow-Up Instructions: Return if symptoms worsen or fail to improve.   Orders:  No orders of the defined types were placed in this encounter.  No orders of the defined types were placed in this encounter.     Procedures: Hand/UE Inj: bilateral carpal tunnel for carpal tunnel syndrome on 03/17/2019 11:24 AM Indications: pain Details: 25 G needle Medications (Right): 1 mL lidocaine 1 %; 1 mL bupivacaine 0.5 %; 40 mg methylPREDNISolone acetate 40 MG/ML Medications (Left): 1 mL lidocaine 1 %; 1 mL bupivacaine 0.5 %; 40 mg methylPREDNISolone acetate 40 MG/ML Outcome: tolerated well, no immediate complications Patient was prepped and draped in the usual sterile fashion.       Clinical Data: No additional findings.   Subjective: Chief Complaint  Patient presents with  . Right Hand - Pain  . Left Hand - Pain    Crystal Salazar returns today for follow-up of her bilateral carpal tunnel syndrome.  She is requesting injections.  Previous injection was June 2019 which gave her good relief.  Denies any changes in medical history.   Review of Systems   Objective: Vital Signs: There were no vitals taken for this visit.  Physical Exam  Ortho Exam Bilateral hand exams are stable. Specialty Comments:  No specialty comments available.  Imaging: No results found.   PMFS History: Patient Active Problem List   Diagnosis Date Noted  . Bilateral carpal tunnel syndrome 04/28/2018  . GERD (gastroesophageal reflux  disease) 04/28/2018  . BMI 35.0-35.9,adult 04/28/2018  . IUD (intrauterine device) in place 04/28/2018  . Preventative health care 09/07/2016  . Vaginal discharge 09/07/2016  . Headache 09/07/2016  . Dizziness and giddiness 09/07/2016  . Stress incontinence 03/03/2016  . 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 Achilles tendinitis of right lower extremity 03/03/2016   No past medical history on file.  Family History  Problem Relation Age of Onset  . Hypertension Mother   . Diabetes Mellitus I Father   . Diabetes Father   . Hypertension Father   . Cancer Paternal Grandmother   . Cancer Paternal Grandfather   . Breast cancer Maternal Aunt     Past Surgical History:  Procedure Laterality Date  . ECTOPIC PREGNANCY SURGERY    . ESOPHAGEAL DILATION     Social History   Occupational History  . Not on file  Tobacco Use  . Smoking status: Former Smoker    Last attempt to quit: 07/05/2015    Years since quitting: 3.7  . Smokeless tobacco: Never Used  Substance and Sexual Activity  . Alcohol use: Yes    Alcohol/week: 0.0 standard drinks    Comment: occ  . Drug use: No  . Sexual activity: Yes    Birth control/protection: I.U.D.

## 2019-03-29 ENCOUNTER — Ambulatory Visit: Payer: Medicaid Other

## 2019-10-18 ENCOUNTER — Ambulatory Visit (INDEPENDENT_AMBULATORY_CARE_PROVIDER_SITE_OTHER): Payer: BC Managed Care – PPO | Admitting: Obstetrics

## 2019-10-18 ENCOUNTER — Other Ambulatory Visit: Payer: Self-pay

## 2019-10-18 DIAGNOSIS — R300 Dysuria: Secondary | ICD-10-CM

## 2019-10-18 LAB — POCT URINALYSIS DIPSTICK
Bilirubin, UA: NEGATIVE
Glucose, UA: NEGATIVE
Ketones, UA: NEGATIVE
Nitrite, UA: POSITIVE
Protein, UA: NEGATIVE
Spec Grav, UA: 1.02 (ref 1.010–1.025)
Urobilinogen, UA: 0.2 E.U./dL
pH, UA: 6 (ref 5.0–8.0)

## 2019-10-18 MED ORDER — NITROFURANTOIN MONOHYD MACRO 100 MG PO CAPS: 100.0000 mg | ORAL_CAPSULE | Freq: Two times a day (BID) | ORAL | 2 refills | Status: DC

## 2019-10-18 NOTE — Addendum Note (Signed)
Addended by: Delrae Alfred on: 10/18/2019 04:23 PM   Modules accepted: Orders

## 2019-10-18 NOTE — Progress Notes (Addendum)
Pt is here today for urinalysis. Pt c/o lower abdominal pain for the past week.  Urine appeared cloudy during the time of urinalysis. -EH/RMA  Results for BARBA, SOLT (MRN [id number]) as of 10/18/2019 17:31  Ref. Range 10/18/2019 16:22  Bilirubin, UA Unknown negative  Clarity, UA Unknown cloudy  Color, UA Unknown yellow  Glucose Latest Ref Range: Negative  Negative  Ketones, UA Unknown negative  Leukocytes,UA Latest Ref Range: Negative  Small (1+) (A)  Nitrite, UA Unknown positive  pH, UA Latest Ref Range: 5.0 - 8.0  6.0  Protein,UA Latest Ref Range: Negative  Negative  Specific Gravity, UA Latest Ref Range: 1.010 - 1.025  1.020  Urobilinogen, UA Latest Ref Range: 0.2 or 1.0 E.U./dL 0.2  RBC, UA Unknown moderate   A/P:  UTI. Patient seen and assessed by nursing staff during this encounter. I have reviewed the chart and agree with the documentation and plan.  Macrobid 100 mg PO bid x 7 days.  Urine culture sent.  Baltazar Najjar, MD 10/18/2019 5:34 PM

## 2019-10-20 LAB — URINE CULTURE

## 2019-10-21 ENCOUNTER — Other Ambulatory Visit: Payer: Self-pay | Admitting: Obstetrics

## 2019-11-09 ENCOUNTER — Encounter: Payer: Self-pay | Admitting: Internal Medicine

## 2019-11-13 ENCOUNTER — Other Ambulatory Visit: Payer: Self-pay

## 2019-11-13 ENCOUNTER — Encounter: Payer: Self-pay | Admitting: Internal Medicine

## 2019-11-13 DIAGNOSIS — Z20822 Contact with and (suspected) exposure to covid-19: Secondary | ICD-10-CM

## 2019-11-14 ENCOUNTER — Encounter: Payer: Self-pay | Admitting: Internal Medicine

## 2019-11-16 LAB — NOVEL CORONAVIRUS, NAA: SARS-CoV-2, NAA: NOT DETECTED

## 2019-12-28 ENCOUNTER — Encounter: Payer: Self-pay | Admitting: Internal Medicine

## 2019-12-28 DIAGNOSIS — Z20822 Contact with and (suspected) exposure to covid-19: Secondary | ICD-10-CM

## 2020-01-01 NOTE — Telephone Encounter (Signed)
Called pt again - no answer; mailbox full, unable to leave a message.

## 2020-01-01 NOTE — Telephone Encounter (Signed)
Pt rtc, she has been at work and could not call, she has been exposed as recently as yesterday by 2 friends, 1 friend died sat. She was given the precautions Self quarantine  Keep hands clean and away from face Keep surfaces clean Make sure your house is well ventilated, open a window a couple of times daily to allow for better ventilation Wear a mask when she is around others and maintain a 68ft social distance Make sure dishes and linens are sanitized well  Drink plenty of fluids Rest as much as possible Call 911 for shortness of breath and chest pain  Informed her to call [facility] test, dr Cleaster Corin will enter order and to call back with a ph/fax # for her work, she states she needs a letter stating she needs to self quarantine

## 2020-01-01 NOTE — Telephone Encounter (Signed)
Called pt - no answer; "Mailbox is full", unable to leave a message.

## 2020-01-02 ENCOUNTER — Ambulatory Visit: Payer: BC Managed Care – PPO | Attending: Internal Medicine

## 2020-01-02 ENCOUNTER — Encounter: Payer: Self-pay | Admitting: Internal Medicine

## 2020-01-02 DIAGNOSIS — Z20822 Contact with and (suspected) exposure to covid-19: Secondary | ICD-10-CM

## 2020-01-03 ENCOUNTER — Encounter: Payer: Self-pay | Admitting: Internal Medicine

## 2020-01-04 LAB — NOVEL CORONAVIRUS, NAA: SARS-CoV-2, NAA: NOT DETECTED

## 2020-01-07 ENCOUNTER — Encounter: Payer: Self-pay | Admitting: Internal Medicine

## 2020-01-08 ENCOUNTER — Encounter: Payer: Self-pay | Admitting: Internal Medicine

## 2020-01-08 NOTE — Telephone Encounter (Signed)
Faxed letter from today to [phone]- [employer] [email or web address] Emp# [id number] Case# [id number] Mailed 3 copies to pt

## 2020-01-10 ENCOUNTER — Encounter: Payer: Self-pay | Admitting: Internal Medicine

## 2020-01-10 ENCOUNTER — Ambulatory Visit: Payer: BC Managed Care – PPO | Attending: Internal Medicine

## 2020-01-10 DIAGNOSIS — Z20822 Contact with and (suspected) exposure to covid-19: Secondary | ICD-10-CM

## 2020-01-10 NOTE — Telephone Encounter (Signed)
Per pt after speaking with her work the letter needs to state: It is my medical opinion that Crystal Salazar should remain in quarantine and out of work until 01/15/2020 due to COVID symptoms and recent exposure. Pt is being tested 01/10/2020.

## 2020-01-11 LAB — NOVEL CORONAVIRUS, NAA: SARS-CoV-2, NAA: NOT DETECTED

## 2020-02-05 ENCOUNTER — Encounter: Payer: Self-pay | Admitting: Internal Medicine

## 2020-02-05 ENCOUNTER — Emergency Department (HOSPITAL_COMMUNITY): Payer: BC Managed Care – PPO

## 2020-02-05 ENCOUNTER — Other Ambulatory Visit: Payer: Self-pay

## 2020-02-05 ENCOUNTER — Emergency Department (HOSPITAL_COMMUNITY)
Admission: EM | Admit: 2020-02-05 | Discharge: 2020-02-06 | Disposition: A | Discharge status: Home / Self Care | Payer: BC Managed Care – PPO | Attending: Emergency Medicine | Admitting: Emergency Medicine

## 2020-02-05 ENCOUNTER — Encounter (HOSPITAL_COMMUNITY): Payer: Self-pay | Admitting: Emergency Medicine

## 2020-02-05 DIAGNOSIS — R0789 Other chest pain: Secondary | ICD-10-CM | POA: Diagnosis present

## 2020-02-05 DIAGNOSIS — R0602 Shortness of breath: Secondary | ICD-10-CM | POA: Diagnosis not present

## 2020-02-05 DIAGNOSIS — Z87891 Personal history of nicotine dependence: Secondary | ICD-10-CM | POA: Diagnosis not present

## 2020-02-05 DIAGNOSIS — R079 Chest pain, unspecified: Secondary | ICD-10-CM

## 2020-02-05 MED ORDER — ASPIRIN 81 MG PO CHEW
324.0000 mg | CHEWABLE_TABLET | Freq: Once | ORAL | Status: AC
  Administered 2020-02-06: 324 mg via ORAL
  Filled 2020-02-05: qty 4

## 2020-02-05 MED ORDER — NITROGLYCERIN 0.4 MG SL SUBL
0.4000 mg | SUBLINGUAL_TABLET | SUBLINGUAL | Status: DC | PRN
  Administered 2020-02-06: 0.4 mg via SUBLINGUAL
  Filled 2020-02-05: qty 1

## 2020-02-05 NOTE — Telephone Encounter (Signed)
Pt states for a few days she is having constant chest pain, short of breath, very weak, dizzy when she bends over, nausea, tingling in her arms and hands. She is ask to have someone drive her or call [phone] and come to ED now, she is agreeable Do you agree?

## 2020-02-05 NOTE — Telephone Encounter (Signed)
Pt rtn called. Pt is requesting a phone call back.  Pt states she was seen today at her  job's [facility] [employer].  Pt reporting her BP was 142/99.  Pt has sch an appointment on 02/07/2020 with her PCP Dr. Cleaster Corin.

## 2020-02-05 NOTE — ED Provider Notes (Signed)
[facility] Provider Note   CSN: [id number] Arrival date & time: 02/05/20  1658     History Chief Complaint  Patient presents with  . Chest Pain    Crystal Salazar is a 44 y.o. female.  Patient presents to the ED with a chief complaint of chest pains.  She states that she has been having intermittent chest pains for longer than 1 week.  She describes the pain and sharp and stabbing.  She reports associated SOB.  She denies any radiating pain or associated diaphoresis.  She has not taken anything for her symptoms.  She states her grandmother died of a heart attack at age 60, otherwise, denies any FH of heart disease.  She smokes cigarettes, but states she quit 1 week ago after she began having these symptoms. She has Mirena IUD, but denies any hx of PE/DVT, recent travel or surgery.  She states that her symptoms worsen with exertion.  The history is provided by the patient. No language interpreter was used.       History reviewed. No pertinent past medical history.  Patient Active Problem List   Diagnosis Date Noted  . Bilateral carpal tunnel syndrome 04/28/2018  . GERD (gastroesophageal reflux disease) 04/28/2018  . BMI 35.0-35.9,adult 04/28/2018  . IUD (intrauterine device) in place 04/28/2018  . Preventative health care 09/07/2016  . Vaginal discharge 09/07/2016  . Headache 09/07/2016  . Dizziness and giddiness 09/07/2016  . Stress incontinence 03/03/2016  . 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 Achilles tendinitis of right lower extremity 03/03/2016    Past Surgical History:  Procedure Laterality Date  . ECTOPIC PREGNANCY SURGERY    . ESOPHAGEAL DILATION       OB History    Gravida  6   Para      Term      Preterm      AB  2   Living  4     SAB  1   TAB      Ectopic  1   Multiple      Live Births  4           Family History  Problem Relation Age of Onset  . Hypertension Mother   . Diabetes Mellitus  I Father   . Diabetes Father   . Hypertension Father   . Cancer Paternal Grandmother   . Cancer Paternal Grandfather   . Breast cancer Maternal Aunt     Social History   Tobacco Use  . Smoking status: Former Smoker    Quit date: 07/05/2015    Years since quitting: 4.5  . Smokeless tobacco: Never Used  Substance Use Topics  . Alcohol use: Yes    Alcohol/week: 0.0 standard drinks    Comment: occ  . Drug use: No    Home Medications Prior to Admission medications   Medication Sig Start Date End Date Taking? Authorizing Provider  levonorgestrel (MIRENA) 20 MCG/24HR IUD 1 each by Intrauterine route once.    [provider]  metroNIDAZOLE (FLAGYL) 500 MG tablet Take 1 tablet (500 mg total) by mouth 2 (two) times daily. Patient not taking: Reported on 01/09/2019 09/12/18   Constant, Peggy, MD  nitrofurantoin, macrocrystal-monohydrate, (MACROBID) 100 MG capsule Take 1 capsule (100 mg total) by mouth 2 (two) times daily. 1 po BID x 7days 10/18/19   Shelly Bombard, MD  omeprazole (PRILOSEC) 20 MG capsule Take 1 capsule (20 mg total) by mouth daily. Reported on 03/17/2016 Patient not  taking: Reported on 09/08/2018 04/27/18   Toney Rakes, MD    Allergies    Sulfonamide derivatives  Review of Systems   Review of Systems  All other systems reviewed and are negative.   Physical Exam Updated Vital Signs BP 128/73   Pulse (!) 46   Temp 98.6 F (37 C) (Oral)   Resp 19   SpO2 100%   Physical Exam Vitals and nursing note reviewed.  Constitutional:      General: She is not in acute distress.    Appearance: She is well-developed.  HENT:     Head: Normocephalic and atraumatic.  Eyes:     Conjunctiva/sclera: Conjunctivae normal.  Cardiovascular:     Rate and Rhythm: Normal rate and regular rhythm.     Heart sounds: No murmur.  Pulmonary:     Effort: Pulmonary effort is normal. No respiratory distress.     Breath sounds: Normal breath sounds.  Abdominal:      Palpations: Abdomen is soft.     Tenderness: There is no abdominal tenderness.  Musculoskeletal:        General: Normal range of motion.     Cervical back: Neck supple.  Skin:    General: Skin is warm and dry.  Neurological:     Mental Status: She is alert and oriented to person, place, and time.  Psychiatric:        Mood and Affect: Mood normal.        Behavior: Behavior normal.     ED Results / Procedures / Treatments   Labs (all labs ordered are listed, but only abnormal results are displayed) Labs Reviewed  CBC WITH DIFFERENTIAL/PLATELET  BASIC METABOLIC PANEL  TROPONIN I (HIGH SENSITIVITY)    EKG None  Radiology No results found.  Procedures Procedures (including critical care time)  Medications Ordered in ED Medications  aspirin chewable tablet 324 mg (has no administration in time range)  nitroGLYCERIN (NITROSTAT) SL tablet 0.4 mg (has no administration in time range)    ED Course  I have reviewed the triage vital signs and the nursing notes.  Pertinent labs & imaging results that were available during my care of the patient were reviewed by me and considered in my medical decision making (see chart for details).    MDM Rules/Calculators/A&P                      Patient presents with chest pain x >1 week.  Review of prior visits show no related visits.  DDx includes ACS, PE, pneumothorax, aortic dissection, esophageal rupture, pericarditis, chest wall pain.  Doubt ACS, troponin 4, no ischemic EKG findings, HEART score is: 1.  Low risk for PE, Well's PE score is 0, patient is not tachycardic nor hypoxic.  No evidence of pneumothorax on CXR.  Doubt dissection, no mediastinal widening on CXR, no ripping/tearing chest pain, neurovascularly intact.  Doubt pericarditis, no positional changes, or diffuse ST elevations on EKG.  Question stress or msk.  Delta troponin is 4.  Patient is currently pain free.  Recommend close follow-up with cardiology.   All  findings were discussed with patient.  Patient understands and agrees with the plan.    Final Clinical Impression(s) / ED Diagnoses Final diagnoses:  Nonspecific chest pain    Rx / DC Orders ED Discharge Orders    None       Roxy Horseman, PA-C 02/06/20 [id number]    Shon Baton, MD 02/07/20 [phone]

## 2020-02-05 NOTE — ED Triage Notes (Signed)
Pt endorses left sided sharp intermittent chest pain for 3 days. Denies cardiac hx. Works for [employer] and has been lifting a lot.

## 2020-02-05 NOTE — Telephone Encounter (Signed)
rtc to pt, she said the paramedic at work took her BP 3 times 150/102, 148/ 100 and 143/99 She said he told her she would be fine and to go back to work, she states she told him she had never had symptoms like this and felt bad especially bending over she gets short of breath and dizzy, the chest pain is constant. She said he said to go back to work and she would feel better but to see her doctor. Triage has encouraged pt to go to ED as planned that [facility] could not assure her that it is or isnt serious but it would be best to be evaluated asap, she is agreeable

## 2020-02-05 NOTE — Telephone Encounter (Signed)
Agreed with plan to go to ED

## 2020-02-05 NOTE — Telephone Encounter (Signed)
Agree with ED  

## 2020-02-06 DIAGNOSIS — R0789 Other chest pain: Secondary | ICD-10-CM | POA: Diagnosis not present

## 2020-02-06 LAB — CBC WITH DIFFERENTIAL/PLATELET
Abs Immature Granulocytes: 0 10*3/uL (ref 0.00–0.07)
Basophils Absolute: 0 10*3/uL (ref 0.0–0.1)
Basophils Relative: 1 %
Eosinophils Absolute: 0.2 10*3/uL (ref 0.0–0.5)
Eosinophils Relative: 3 %
HCT: 36.1 % (ref 36.0–46.0)
Hemoglobin: 11.8 g/dL — ABNORMAL LOW (ref 12.0–15.0)
Immature Granulocytes: 0 %
Lymphocytes Relative: 49 %
Lymphs Abs: 2.6 10*3/uL (ref 0.7–4.0)
MCH: 31.8 pg (ref 26.0–34.0)
MCHC: 32.7 g/dL (ref 30.0–36.0)
MCV: 97.3 fL (ref 80.0–100.0)
Monocytes Absolute: 0.4 10*3/uL (ref 0.1–1.0)
Monocytes Relative: 7 %
Neutro Abs: 2.2 10*3/uL (ref 1.7–7.7)
Neutrophils Relative %: 40 %
Platelets: 231 10*3/uL (ref 150–400)
RBC: 3.71 MIL/uL — ABNORMAL LOW (ref 3.87–5.11)
RDW: 11.5 % (ref 11.5–15.5)
WBC: 5.3 10*3/uL (ref 4.0–10.5)
nRBC: 0 % (ref 0.0–0.2)

## 2020-02-06 LAB — BASIC METABOLIC PANEL
Anion gap: 8 (ref 5–15)
BUN: 9 mg/dL (ref 6–20)
CO2: 24 mmol/L (ref 22–32)
Calcium: 8.6 mg/dL — ABNORMAL LOW (ref 8.9–10.3)
Chloride: 105 mmol/L (ref 98–111)
Creatinine, Ser: 0.78 mg/dL (ref 0.44–1.00)
GFR calc Af Amer: >60 mL/min (ref >60)
GFR calc non Af Amer: >60 mL/min (ref >60)
Glucose, Bld: 87 mg/dL (ref 70–99)
Potassium: 3.4 mmol/L — ABNORMAL LOW (ref 3.5–5.1)
Sodium: 137 mmol/L (ref 135–145)

## 2020-02-06 LAB — TROPONIN I (HIGH SENSITIVITY)
Troponin I (High Sensitivity): 4 ng/L (ref <18)
Troponin I (High Sensitivity): 4 ng/L (ref <18)

## 2020-02-06 MED ORDER — POTASSIUM CHLORIDE CRYS ER 20 MEQ PO TBCR
40.0000 meq | EXTENDED_RELEASE_TABLET | Freq: Once | ORAL | Status: AC
  Administered 2020-02-06: 04:00:00 40 meq via ORAL
  Filled 2020-02-06: qty 2

## 2020-02-06 NOTE — ED Notes (Signed)
Went to check on pt's pain and pt was asleep. Will check back again soon

## 2020-02-06 NOTE — ED Notes (Signed)
Pt was discharged from the ED. Pt read and understood discharge paperwork. Pt had vital signs completed. E-signature unavailable. Pt conscious, breathing, and A&Ox4. No distress noted. Pt speaking in complete sentences. Pt ambulated out of the ED with a smooth and steady gait.  

## 2020-02-07 ENCOUNTER — Encounter: Payer: Self-pay | Admitting: Internal Medicine

## 2020-02-07 ENCOUNTER — Ambulatory Visit (INDEPENDENT_AMBULATORY_CARE_PROVIDER_SITE_OTHER): Payer: BC Managed Care – PPO | Admitting: Internal Medicine

## 2020-02-07 VITALS — BP 112/58 | HR 78 | Temp 98.4°F | Wt 181.1 lb

## 2020-02-07 DIAGNOSIS — R079 Chest pain, unspecified: Secondary | ICD-10-CM | POA: Insufficient documentation

## 2020-02-07 DIAGNOSIS — Z1211 Encounter for screening for malignant neoplasm of colon: Secondary | ICD-10-CM

## 2020-02-07 DIAGNOSIS — Z1231 Encounter for screening mammogram for malignant neoplasm of breast: Secondary | ICD-10-CM

## 2020-02-07 DIAGNOSIS — F17211 Nicotine dependence, cigarettes, in remission: Secondary | ICD-10-CM

## 2020-02-07 DIAGNOSIS — D649 Anemia, unspecified: Secondary | ICD-10-CM | POA: Insufficient documentation

## 2020-02-07 NOTE — Progress Notes (Signed)
   CC: follow-up chest pain  HPI:  [name] is a 44 y.o. with PMH as below.   Please see A&P for assessment of the patient's acute and chronic medical conditions.   She is here for follow-up for two weeks intermittent chest pain. She went to the ED for this two days ago. Troponins, EKG negative for ACS. CXR within normal limits. She was noted to have bradycardia but had no dizziness or SOB. She is still having the chest pain on the left side. It lasts for a few seconds a few times a day. She denies palpitations, endorses occasional dizziness not associated with sitting to standing. She has chronic tingling in her LE and UE bilaterally. She has occasional swelling in her ankles but is on her feet all day at work. Pain is not associated with eating and does not occur at any particular time. She does endorse recent night sweats. Denies weight loss.  She works at [employer] and last July started working with heavier equipment last July. She is lifting 50lbs sometimes.  She stopped smoking two weeks ago after having smoked the past two years with intermittent smoking history.   Family history of colon cancer in her grandfather at age 50-80. Grandmother diet of heart attack at age 42-70.   No past medical history on file.   Review of Systems:   10 point ROS negative except as noted in HPI  Physical Exam: Constitution: NAD, appears stated age Eyes: no icterus or injection  Cardio: RRR, no m/r/g, no LE edema, no JVP Respiratory: CTA, no w/r/r Abdominal: NTTP, soft, non-distended MSK: moving all extremities, chest wall NTTP, no palpable breast mass or axillary lymphadenopathy Neuro: normal affect, a&ox3 Skin: c/d/i    Vitals:   02/07/20 1546  BP: (!) 112/58  Pulse: 78  SpO2: 100%     Assessment & Plan:   See Encounters Tab for problem based charting.  Patient discussed with Dr. Sandre Kitty

## 2020-02-07 NOTE — Assessment & Plan Note (Signed)
Mild anemia on CBC. She has occasional dizziness, chronic LE tingling.   - ferritin, iron, tibc, B12, MMA  - screening colonoscopy as she is 43

## 2020-02-07 NOTE — Addendum Note (Signed)
Addended by: Guinevere Scarlet A on: 02/07/2020 09:11 PM   Modules accepted: Orders

## 2020-02-07 NOTE — Patient Instructions (Addendum)
Thank you for allowing [place] to provide your care today. Today we discussed your intermittent chest pain.    Take tylenol as needed for left sided chest pain. Avoid heavy lifting for the next week.   I have ordered the following labs for you:  Ferritin, iron, vitamin B12, lipid panel, hemoglobin a1c   I will call with your results.   I have placed a referral for colonoscopy. They will call you with an appointment.   I have also placed an appointment for screening mammography. They will call you for an appointment.    Please follow-up if symptoms do not improve, if you have increased dizzinsess or shortness of breath.   Please call the internal medicine center clinic if you have any questions or concerns, we may be able to help and keep you from a long and expensive emergency room wait. Our clinic and after hours phone number is [phone], the best time to call is Monday through Friday 9 am to 4 pm but there is always someone available 24/7 if you have an emergency. If you need medication refills please notify your pharmacy one week in advance and they will send [place] a request.

## 2020-02-07 NOTE — Assessment & Plan Note (Addendum)
Work-up for acute cause negative with recent ED visit. She is very low risk with heart score of 2. Physical exam within normal limits, no bradycardia or irregularity. No axillary lymphadenopathy or breast mass on exam. Symptoms are likely musculoskeletal with heavy lifting at her place of work.   - work note for lighter lifting the next week - tylenol prn for pain - follow-up if symptoms do not improve  - screening mammogram

## 2020-02-08 NOTE — Progress Notes (Signed)
Internal Medicine Clinic Attending  Case discussed with Dr. Seawell at the time of the visit.  We reviewed the resident's history and exam and pertinent patient test results.  I agree with the assessment, diagnosis, and plan of care documented in the resident's note.  Alexander Raines, M.D., Ph.D.  

## 2020-02-12 ENCOUNTER — Encounter: Payer: Self-pay | Admitting: Internal Medicine

## 2020-02-13 ENCOUNTER — Encounter: Payer: Self-pay | Admitting: Gastroenterology

## 2020-02-14 LAB — HEMOGLOBIN A1C
Est. average glucose Bld gHb Est-mCnc: 105 mg/dL
Hgb A1c MFr Bld: 5.3 % (ref 4.8–5.6)

## 2020-02-14 LAB — IRON AND TIBC
Iron Saturation: 28 % (ref 15–55)
Iron: 78 ug/dL (ref 27–159)
Total Iron Binding Capacity: 274 ug/dL (ref 250–450)
UIBC: 196 ug/dL (ref 131–425)

## 2020-02-14 LAB — METHYLMALONIC ACID, SERUM: Methylmalonic Acid: 106 nmol/L (ref 0–378)

## 2020-02-14 LAB — LIPID PANEL
Chol/HDL Ratio: 2.6 ratio (ref 0.0–4.4)
Cholesterol, Total: 146 mg/dL (ref 100–199)
HDL: 56 mg/dL (ref >39)
LDL Chol Calc (NIH): 76 mg/dL (ref 0–99)
Triglycerides: 72 mg/dL (ref 0–149)
VLDL Cholesterol Cal: 14 mg/dL (ref 5–40)

## 2020-02-14 LAB — FERRITIN: Ferritin: 149 ng/mL (ref 15–150)

## 2020-02-14 LAB — VITAMIN B12: Vitamin B-12: 344 pg/mL (ref 232–1245)

## 2020-02-21 ENCOUNTER — Other Ambulatory Visit: Payer: Self-pay

## 2020-02-21 ENCOUNTER — Ambulatory Visit (AMBULATORY_SURGERY_CENTER): Payer: Self-pay

## 2020-02-21 VITALS — Temp 97.2°F | Ht 62.0 in | Wt 180.8 lb

## 2020-02-21 DIAGNOSIS — Z01818 Encounter for other preprocedural examination: Secondary | ICD-10-CM

## 2020-02-21 DIAGNOSIS — Z1211 Encounter for screening for malignant neoplasm of colon: Secondary | ICD-10-CM

## 2020-02-21 MED ORDER — NA SULFATE-K SULFATE-MG SULF 17.5-3.13-1.6 GM/177ML PO SOLN: 1.0000 | Freq: Once | ORAL | 0 refills | Status: AC

## 2020-02-21 NOTE — Progress Notes (Signed)
No egg or soy allergy known to patient  No issues with past sedation with any surgeries  or procedures, no intubation problems  No diet pills per patient No home 02 use per patient  No blood thinners per patient  Pt denies issues with constipation  No A fib or A flutter  EMMI video sent to pt's e mail   Medicaid suprep  Due to the COVID-19 pandemic we are asking patients to follow these guidelines. Please only bring one care partner. Please be aware that your care partner may wait in the car in the parking lot or if they feel like they will be too hot to wait in the car, they may wait in the lobby on the 4th floor. All care partners are required to wear a mask the entire time (we do not have any that we can provide them), they need to practice social distancing, and we will do a Covid check for all patient's and care partners when you arrive. Also we will check their temperature and your temperature. If the care partner waits in their car they need to stay in the parking lot the entire time and we will call them on their cell phone when the patient is ready for discharge so they can bring the car to the front of the building. Also all patient's will need to wear a mask into building.

## 2020-03-01 ENCOUNTER — Ambulatory Visit (INDEPENDENT_AMBULATORY_CARE_PROVIDER_SITE_OTHER): Payer: BC Managed Care – PPO

## 2020-03-01 ENCOUNTER — Other Ambulatory Visit: Payer: Self-pay | Admitting: Gastroenterology

## 2020-03-01 DIAGNOSIS — Z1159 Encounter for screening for other viral diseases: Secondary | ICD-10-CM

## 2020-03-02 LAB — SARS CORONAVIRUS 2 (TAT 6-24 HRS): SARS Coronavirus 2: NEGATIVE

## 2020-03-06 ENCOUNTER — Ambulatory Visit (AMBULATORY_SURGERY_CENTER): Payer: BC Managed Care – PPO | Admitting: Gastroenterology

## 2020-03-06 ENCOUNTER — Encounter: Payer: Self-pay | Admitting: Gastroenterology

## 2020-03-06 ENCOUNTER — Other Ambulatory Visit: Payer: Self-pay

## 2020-03-06 VITALS — BP 125/66 | HR 79 | Temp 97.3°F | Resp 15 | Ht 62.0 in | Wt 180.0 lb

## 2020-03-06 DIAGNOSIS — Z1211 Encounter for screening for malignant neoplasm of colon: Secondary | ICD-10-CM

## 2020-03-06 MED ORDER — SODIUM CHLORIDE 0.9 % IV SOLN: 500.0000 mL | Freq: Once | INTRAVENOUS | Status: DC

## 2020-03-06 NOTE — Progress Notes (Signed)
Pt's states no medical or surgical changes since previsit or office visit. VS by CW. Temp by LC 

## 2020-03-06 NOTE — Op Note (Signed)
[facility] Patient Name: Crystal Salazar Procedure Date: 03/06/2020 9:48 AM MRN: [id number] Endoscopist: Sherilyn Cooter L. Myrtie Neither , MD Age: 44 Referring MD:  Date of Birth: [birth year] Gender: Female Account #: [id number] Procedure:                Colonoscopy Indications:              Screening for colorectal malignant neoplasm, This                            is the patient's first colonoscopy Medicines:                Monitored Anesthesia Care Procedure:                Pre-Anesthesia Assessment:                           - Prior to the procedure, a History and Physical                            was performed, and patient medications and                            allergies were reviewed. The patient's tolerance of                            previous anesthesia was also reviewed. The risks                            and benefits of the procedure and the sedation                            options and risks were discussed with the patient.                            All questions were answered, and informed consent                            was obtained. Prior Anticoagulants: The patient has                            taken no previous anticoagulant or antiplatelet                            agents. ASA Grade Assessment: II - A patient with                            mild systemic disease. After reviewing the risks                            and benefits, the patient was deemed in                            satisfactory condition to undergo the procedure.  After obtaining informed consent, the colonoscope                            was passed under direct vision. Throughout the                            procedure, the patient's blood pressure, pulse, and                            oxygen saturations were monitored continuously. The                            Colonoscope was introduced through the anus and                            advanced to the the cecum,  identified by                            appendiceal orifice and ileocecal valve. The                            colonoscopy was performed without difficulty. The                            patient tolerated the procedure well. The quality                            of the bowel preparation was excellent. The                            ileocecal valve, appendiceal orifice, and rectum                            were photographed. Scope In: 10:19:02 AM Scope Out: 10:32:17 AM Scope Withdrawal Time: 0 hours 9 minutes 11 seconds  Total Procedure Duration: 0 hours 13 minutes 15 seconds  Findings:                 The perianal and digital rectal examinations were                            normal.                           The entire examined colon appeared normal.                            (retroflexion not performed in rectum due to narrow                            anatomy) Complications:            No immediate complications. Estimated Blood Loss:     Estimated blood loss: none. Impression:               - The entire examined colon is normal.                           -  No specimens collected. Recommendation:           - Patient has a contact number available for                            emergencies. The signs and symptoms of potential                            delayed complications were discussed with the                            patient. Return to normal activities tomorrow.                            Written discharge instructions were provided to the                            patient.                           - Resume previous diet.                           - Continue present medications.                           - Repeat colonoscopy in 10 years for screening                            purposes. Clyde Upshaw L. Loletha Carrow, MD 03/06/2020 10:35:34 AM This report has been signed electronically.

## 2020-03-06 NOTE — Patient Instructions (Signed)
Discharge instructions given. Normal exam. Resume previous medications. YOU HAD AN ENDOSCOPIC PROCEDURE TODAY AT THE [facility]:   Refer to the procedure report that was given to you for any specific questions about what was found during the examination.  If the procedure report does not answer your questions, please call your gastroenterologist to clarify.  If you requested that your care partner not be given the details of your procedure findings, then the procedure report has been included in a sealed envelope for you to review at your convenience later.  YOU SHOULD EXPECT: Some feelings of bloating in the abdomen. Passage of more gas than usual.  Walking can help get rid of the air that was put into your GI tract during the procedure and reduce the bloating. If you had a lower endoscopy (such as a colonoscopy or flexible sigmoidoscopy) you may notice spotting of blood in your stool or on the toilet paper. If you underwent a bowel prep for your procedure, you may not have a normal bowel movement for a few days.  Please Note:  You might notice some irritation and congestion in your nose or some drainage.  This is from the oxygen used during your procedure.  There is no need for concern and it should clear up in a day or so.  SYMPTOMS TO REPORT IMMEDIATELY:  Following lower endoscopy (colonoscopy or flexible sigmoidoscopy):  Excessive amounts of blood in the stool  Significant tenderness or worsening of abdominal pains  Swelling of the abdomen that is new, acute  Fever of 100F or higher   For urgent or emergent issues, a gastroenterologist can be reached at any hour by calling [phone]. Do not use MyChart messaging for urgent concerns.    DIET:  We do recommend a small meal at first, but then you may proceed to your regular diet.  Drink plenty of fluids but you should avoid alcoholic beverages for 24 hours.  ACTIVITY:  You should plan to take it easy for the rest of  today and you should NOT DRIVE or use heavy machinery until tomorrow (because of the sedation medicines used during the test).    FOLLOW UP: Our staff will call the number listed on your records 48-72 hours following your procedure to check on you and address any questions or concerns that you may have regarding the information given to you following your procedure. If we do not reach you, we will leave a message.  We will attempt to reach you two times.  During this call, we will ask if you have developed any symptoms of COVID 19. If you develop any symptoms (ie: fever, flu-like symptoms, shortness of breath, cough etc.) before then, please call [phone].  If you test positive for Covid 19 in the 2 weeks post procedure, please call and report this information to us.    If any biopsies were taken you will be contacted by phone or by letter within the next 1-3 weeks.  Please call us at [phone] if you have not heard about the biopsies in 3 weeks.    SIGNATURES/CONFIDENTIALITY: You and/or your care partner have signed paperwork which will be entered into your electronic medical record.  These signatures attest to the fact that that the information above on your After Visit Summary has been reviewed and is understood.  Full responsibility of the confidentiality of this discharge information lies with you and/or your care-partner.  

## 2020-03-06 NOTE — Progress Notes (Signed)
PT taken to PACU. Monitors in place. VSS. Report given to RN. 

## 2020-03-08 ENCOUNTER — Telehealth: Payer: Self-pay | Admitting: *Deleted

## 2020-03-08 ENCOUNTER — Telehealth: Payer: Self-pay

## 2020-03-08 NOTE — Telephone Encounter (Signed)
Attempted to reach patient for post-procedure f/u call. No answer. Could not leave message as the mailbox is full. Staff will make another attempt to reach her later today.

## 2020-03-08 NOTE — Telephone Encounter (Signed)
Returning call. Is doing good after her procedure and is back to work.

## 2020-03-08 NOTE — Telephone Encounter (Signed)
Mailbox full unable to leave message.

## 2020-03-11 ENCOUNTER — Encounter: Payer: Self-pay | Admitting: Internal Medicine

## 2020-03-11 NOTE — Telephone Encounter (Signed)
Returned call to patient. States she twisted left ankle yesterday while ice skating. Only noticed pain after removing skate. Left ankle pain and swelling have increased today while at work. Discussed RICE, Aleve. [facility] appt given for tomorrow AM. Advised to go to Urgent Care or ED if pain becomes unbearable. She is in agreement. Kinnie Feil, BSN, RN-BC

## 2020-03-12 ENCOUNTER — Encounter: Payer: Self-pay | Admitting: Internal Medicine

## 2020-03-12 ENCOUNTER — Ambulatory Visit (INDEPENDENT_AMBULATORY_CARE_PROVIDER_SITE_OTHER): Payer: BC Managed Care – PPO | Admitting: Internal Medicine

## 2020-03-12 ENCOUNTER — Other Ambulatory Visit: Payer: Self-pay

## 2020-03-12 ENCOUNTER — Ambulatory Visit (HOSPITAL_COMMUNITY)
Admission: RE | Admit: 2020-03-12 | Discharge: 2020-03-12 | Disposition: A | Discharge status: Home / Self Care | Payer: BC Managed Care – PPO | Source: Ambulatory Visit | Attending: Internal Medicine | Admitting: Internal Medicine

## 2020-03-12 VITALS — BP 125/77 | HR 79 | Temp 98.0°F | Ht 62.0 in | Wt 183.9 lb

## 2020-03-12 DIAGNOSIS — M25572 Pain in left ankle and joints of left foot: Secondary | ICD-10-CM

## 2020-03-12 MED ORDER — MELOXICAM 15 MG PO TABS: ORAL_TABLET | ORAL | 0 refills | Status: DC

## 2020-03-12 NOTE — Assessment & Plan Note (Addendum)
Patient reports 2 days of left ankle pain. The pain started after she went ice-skating. She did not notice the pain until she was taking her skates of. She denies and falls while skate but she was slipping a good amount and having to catch herself. The pain is located at her L lateral posterior ankle with some associated tenderness over her fibula. She has done some foot soaks and kept the foot elevated at times, but has not taken and antiinflammatories. She has been wearing a soft ankle brace. She has had to to work on her feet for 10 hours yesterday which worsened the pain, but has today and tomorrow off. Her swelling as improved some per her report.  On exam she does have tenderness over her posterior lateral ankle and distal fibula. ROM limited somewhat by pain but is intact as well as strength and sensation. POCUS exam did not show boney break, but will need to confirm with x-ray due to boney tenderness.   Will treat with NSAIDs, Rice, Her soft splint and eval with x-ray as above. She is to start strengthening exercises when pain and swelling has improved. - DG Left Ankle Complete - Mobic 15mg  Daily for 7 days, then as needed - Rest for the next couple of days while off work - Soft splint as needed - Keep foot elevated and ICE several time per day

## 2020-03-12 NOTE — Patient Instructions (Addendum)
Thank you for allowing [place] to care for you  For your ankle pain - This is consistent with an ankle sprain - We will check an X-ray today - Take Mobic 15mg  Daily for 7 days then as needed - Avoid painful activity for the next couple of days while you are off work - Apply ice every 1-2 hours for about 10-15 min - When pain and swelling improve, start strengthening exercises described below  Follow up as needed   Ankle Exercises Ask your health care provider which exercises are safe for you. Do exercises exactly as told by your health care provider and adjust them as directed. It is normal to feel mild stretching, pulling, tightness, or mild discomfort as you do these exercises. Stop right away if you feel sudden pain or your pain gets worse. Do not begin these exercises until told by your health care provider. Stretching and range-of-motion exercises These exercises warm up your muscles and joints and improve the movement and flexibility of your ankle. These exercises may also help to relieve pain. Dorsiflexion/plantar flexion  1. Sit with your __________ knee straight or bent. Do not rest your foot on anything. 2. Flex your __________ ankle to tilt the top of your foot toward your shin. This is called dorsiflexion. 3. Hold this position for __________ seconds. 4. Point your toes downward to tilt the top of your foot away from your shin. This is called plantar flexion. 5. Hold this position for __________ seconds. Repeat __________ times. Complete this exercise __________ times a day. Ankle alphabet  1. Sit with your __________ foot supported at your lower leg. ? Do not rest your foot on anything. ? Make sure your foot has room to move freely. 2. Think of your __________ foot as a paintbrush: ? Move your foot to trace each letter of the alphabet in the air. Keep your hip and knee still while you trace the letters. Trace every letter from A to Z. ? Make the letters as large as you can  without causing or increasing any discomfort. Repeat __________ times. Complete this exercise __________ times a day. Passive ankle dorsiflexion This is an exercise in which something or someone moves your ankle for you. You do not move it yourself. 1. Sit on a chair that is placed on a non-carpeted surface. 2. Place your __________ foot on the floor, directly under your __________ knee. Extend your __________ leg for support. 3. Keeping your heel down, slide your __________ foot back toward the chair until you feel a stretch at your ankle or calf. If you do not feel a stretch, slide your buttocks forward to the edge of the chair while keeping your heel down. 4. Hold this stretch for __________ seconds. Repeat __________ times. Complete this exercise __________ times a day. Strengthening exercises These exercises build strength and endurance in your ankle. Endurance is the ability to use your muscles for a long time, even after they get tired. Dorsiflexors These are muscles that lift your foot up. 1. Secure a rubber exercise band or tube to an object, such as a table leg, that will stay still when the band is pulled. Secure the other end around your __________ foot. 2. Sit on the floor, facing the object with your __________ leg extended. The band or tube should be slightly tense when your foot is relaxed. 3. Slowly flex your __________ ankle and toes to bring your foot toward your shin. 4. Hold this position for __________ seconds. 5. Slowly  return your foot to the starting position, controlling the band as you do that. Repeat __________ times. Complete this exercise __________ times a day. Plantar flexors These are muscles that push your foot down. 1. Sit on the floor with your __________ leg extended. 2. Loop a rubber exercise band or tube around the ball of your __________ foot. The ball of your foot is on the walking surface, right under your toes. The band or tube should be slightly tense  when your foot is relaxed. 3. Slowly point your toes downward, pushing them away from you. 4. Hold this position for __________ seconds. 5. Slowly release the tension in the band or tube, controlling smoothly until your foot is back in the starting position. Repeat __________ times. Complete this exercise __________ times a day. Towel curls  1. Sit in a chair on a non-carpeted surface, and put your feet on the floor. 2. Place a towel in front of your feet. If told by your health care provider, add a __________ pound weight to the end of the towel. 3. Keeping your heel on the floor, put your __________ foot on the towel. 4. Pull the towel toward you by grabbing the towel with your toes and curling them under. Keep your heel on the floor. 5. Let your toes relax. 6. Grab the towel again. Keep pulling the towel until it is completely underneath your foot. Repeat __________ times. Complete this exercise __________ times a day. Standing plantar flexion This is an exercise in which you use your toes to lift your body's weight while standing. 1. Stand with your feet shoulder-width apart. 2. Keep your weight spread evenly over the width of your feet while you rise up on your toes. Use a wall or table to steady yourself if needed, but try not to use it for support. 3. If this exercise is too easy, try these options: ? Shift your weight toward your __________ leg until you feel challenged. ? If told by your health care provider, lift your uninjured leg off the floor. 4. Hold this position for __________ seconds. Repeat __________ times. Complete this exercise __________ times a day. Tandem walking 1. Stand with one foot directly in front of the other. 2. Slowly raise your back foot up, lifting your heel before your toes, and place it directly in front of your other foot. 3. Continue to walk in this heel-to-toe way for __________ or for as long as told by your health care provider. Have a countertop or  wall nearby to use if needed to keep your balance, but try not to hold onto anything for support. Repeat __________ times. Complete this exercise __________ times a day. This information is not intended to replace advice given to you by your health care provider. Make sure you discuss any questions you have with your health care provider. Document Revised: 09/10/2018 Document Reviewed: 09/12/2018 Elsevier Patient Education  2020 [employer].

## 2020-03-12 NOTE — Progress Notes (Signed)
   CC: Left ankle pain  HPI:   [name] is a 44 y.o. F with PMHx listed below presenting for Left ankle pain. Please see the A&P for the status of the patient's chronic medical problems.  Past Medical History:  Diagnosis Date  . GERD (gastroesophageal reflux disease)    Review of Systems:  Performed and all others negative.  Physical Exam:  Vitals:   03/12/20 0924  BP: 125/77  Pulse: 79  Temp: 98 F (36.7 C)  TempSrc: Oral  SpO2: 100%  Weight: 183 lb 14.4 oz (83.4 kg)  Height: 5\' 2"  (1.575 m)   Physical Exam Constitutional:      General: She is not in acute distress.    Appearance: Normal appearance.  Cardiovascular:     Rate and Rhythm: Normal rate.     Pulses: Normal pulses.  Pulmonary:     Effort: Pulmonary effort is normal. No respiratory distress.  Musculoskeletal:        General: No swelling or deformity.     Comments: L Ankle: - Inspection: Mild ankle edema worse at posterior lateral region. No bruising nor erythema. - Palpation: TTP over later malleolus and posterior compartment. - Strength: Grossly intact plantar flexion limited by pain - ROM: Full ROM - Neuro/vasc: NV intact - Special Tests: Limited by pain  Skin:    General: Skin is warm and dry.  Neurological:     General: No focal deficit present.     Mental Status: Mental status is at baseline.    Assessment & Plan:   See Encounters Tab for problem based charting.  Patient discussed with Dr. 

## 2020-03-14 ENCOUNTER — Encounter: Payer: Self-pay | Admitting: Internal Medicine

## 2020-03-14 NOTE — Progress Notes (Signed)
Letter sent with normal X-ray results

## 2020-03-14 NOTE — Progress Notes (Signed)
Internal Medicine Clinic Attending  Case discussed with Dr. Melvin at the time of the visit.  We reviewed the resident's history and exam and pertinent patient test results.  I agree with the assessment, diagnosis, and plan of care documented in the resident's note.  Joby Hershkowitz, M.D., Ph.D.  

## 2020-03-26 ENCOUNTER — Encounter: Payer: Self-pay | Admitting: Internal Medicine

## 2020-03-29 NOTE — Telephone Encounter (Signed)
Agree with plan. She will just need to go to ER if she becomes very short of breath.

## 2020-04-04 ENCOUNTER — Other Ambulatory Visit: Payer: Self-pay | Admitting: Internal Medicine

## 2020-04-04 DIAGNOSIS — M25572 Pain in left ankle and joints of left foot: Secondary | ICD-10-CM

## 2020-04-06 NOTE — Telephone Encounter (Signed)
Refused. Short term prescription for acute pain. Reassess or follow up with PCP if pain continued.

## 2020-06-13 ENCOUNTER — Encounter: Payer: Self-pay | Admitting: Internal Medicine

## 2020-06-14 ENCOUNTER — Encounter: Payer: Self-pay | Admitting: Internal Medicine

## 2020-08-22 ENCOUNTER — Ambulatory Visit (INDEPENDENT_AMBULATORY_CARE_PROVIDER_SITE_OTHER): Payer: BC Managed Care – PPO | Admitting: Orthopaedic Surgery

## 2020-08-22 ENCOUNTER — Encounter: Payer: Self-pay | Admitting: Orthopaedic Surgery

## 2020-08-22 DIAGNOSIS — M65342 Trigger finger, left ring finger: Secondary | ICD-10-CM

## 2020-08-22 DIAGNOSIS — G5601 Carpal tunnel syndrome, right upper limb: Secondary | ICD-10-CM | POA: Diagnosis not present

## 2020-08-22 DIAGNOSIS — M1812 Unilateral primary osteoarthritis of first carpometacarpal joint, left hand: Secondary | ICD-10-CM | POA: Diagnosis not present

## 2020-08-22 NOTE — Progress Notes (Signed)
Office Visit Note   Patient: Crystal Salazar           Date of Birth: 03-May-1976           MRN: [id number] Visit Date: 08/22/2020              Requested by: Guinevere Scarlet A, DO [address] [place],  [place] [address] PCP: Versie Starks, DO   Assessment & Plan: Visit Diagnoses:  1. Right carpal tunnel syndrome   2. Osteoarthritis of carpometacarpal (CMC) joint of left thumb, unspecified osteoarthritis type   3. Trigger finger, left ring finger     Plan: Impression is #1 right wrist recurrent carpal tunnel syndrome.  #2 left first CMC joint osteoarthritis.  #3 left ring trigger finger.  In regards to the right carpal tunnel syndrome, we will reinject this today.  Of note, during the injection she pulled back as the cortisone was going in and therefore not all of the medication was injected.  In regards to the left [facility] osteoarthritis we discussed cortisone injection but she would like to hold off for now.  She does not feel as though her trigger finger is symptomatic enough to inject quite yet.  She will follow-up with [place] as needed.  Follow-Up Instructions: Return if symptoms worsen or fail to improve.   Orders:  No orders of the defined types were placed in this encounter.  No orders of the defined types were placed in this encounter.     Procedures: No procedures performed   Clinical Data: No additional findings.   Subjective: Chief Complaint  Patient presents with  . Right Hand - Pain    HPI patient is a pleasant 44 year old female who comes in today with recurrent numbness and tingling to her right hand.  She does have a history of mild to moderate carpal tunnel syndrome which was seen on nerve conduction studies from June 2019.  She has had cortisone injections to both carpal tunnels twice over the past 2 years.  Her last injections were in March 2020 with great relief of symptoms until recently.  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  The numbness and tingling she gets is to the thumb, index, long and ring fingers of the right hand.  She is really not having recurrent symptoms on the left.  She frequently wakes up at night shaking her hands as well as when she is driving here.  The pain she is actually getting on the left hand is to the first [facility] joint where she has known moderate degenerative changes.  Review of Systems as detailed in HPI.  All others reviewed and are negative.   Objective: Vital Signs: There were no vitals taken for this visit.  Physical Exam well-developed well-nourished female no acute distress.  Alert and oriented x3.  Ortho Exam examination of the right hand reveals positive Phalen and positive Tinel.  No thenar atrophy.  Full grip strength.  Left hand shows a negative Phalen and negative Tinel.  She has moderate tenderness to the first [facility] joint with a positive grind test.  No tenderness to the first dorsal compartment.  No thenar atrophy.  She does have reproducible triggering to the left ring finger. No tenderness to the A-1 pulley  Specialty Comments:  No specialty comments available.  Imaging: No new imaging   PMFS History: Patient Active Problem List   Diagnosis Date  Noted  . Left ankle pain 03/12/2020  . Chest pain 02/07/2020  . Anemia 02/07/2020  . Bilateral carpal tunnel syndrome 04/28/2018  . GERD (gastroesophageal reflux disease) 04/28/2018  . BMI 35.0-35.9,adult 04/28/2018  . IUD (intrauterine device) in place 04/28/2018  . Preventative health care 09/07/2016  . Vaginal discharge 09/07/2016  . Headache 09/07/2016  . Dizziness and giddiness 09/07/2016  . Stress incontinence 03/03/2016  . 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 Achilles tendinitis of right lower extremity 03/03/2016   Past Medical History:  Diagnosis Date  . GERD (gastroesophageal reflux disease)     Family History  Problem Relation Age of Onset  . Hypertension  Mother   . Diabetes Mellitus I Father   . Diabetes Father   . Hypertension Father   . Cancer Paternal Grandmother   . Cancer Paternal Grandfather   . Breast cancer Maternal Aunt   . Colon cancer Neg Hx   . Colon polyps Neg Hx   . Esophageal cancer Neg Hx   . Rectal cancer Neg Hx   . Stomach cancer Neg Hx     Past Surgical History:  Procedure Laterality Date  . ECTOPIC PREGNANCY SURGERY    . ESOPHAGEAL DILATION    . UPPER GASTROINTESTINAL ENDOSCOPY  2011   Dr Arlyce Dice   Social History   Occupational History  . Not on file  Tobacco Use  . Smoking status: Former Smoker    Types: Cigarettes    Quit date: 07/05/2015    Years since quitting: 5.1  . Smokeless tobacco: Never Used  . Tobacco comment: restarted 2020, quit 12/2019  Vaping Use  . Vaping Use: Never used  Substance and Sexual Activity  . Alcohol use: Yes    Alcohol/week: 0.0 standard drinks    Comment: occ, less than weekly  . Drug use: No  . Sexual activity: Yes    Birth control/protection: I.U.D.

## 2020-10-09 ENCOUNTER — Other Ambulatory Visit: Payer: Self-pay

## 2020-10-09 ENCOUNTER — Ambulatory Visit (INDEPENDENT_AMBULATORY_CARE_PROVIDER_SITE_OTHER): Payer: BC Managed Care – PPO

## 2020-10-09 VITALS — Wt 185.0 lb

## 2020-10-09 DIAGNOSIS — R319 Hematuria, unspecified: Secondary | ICD-10-CM

## 2020-10-09 DIAGNOSIS — N390 Urinary tract infection, site not specified: Secondary | ICD-10-CM

## 2020-10-09 LAB — POCT URINALYSIS DIPSTICK
Glucose, UA: NEGATIVE
Ketones, UA: POSITIVE
Nitrite, UA: NEGATIVE
Protein, UA: POSITIVE — AB
Spec Grav, UA: >=1.03 — AB (ref 1.010–1.025)
Urobilinogen, UA: 0.2 E.U./dL
pH, UA: 6 (ref 5.0–8.0)

## 2020-10-09 MED ORDER — NITROFURANTOIN MONOHYD MACRO 100 MG PO CAPS: 100.0000 mg | ORAL_CAPSULE | Freq: Two times a day (BID) | ORAL | 0 refills | Status: AC

## 2020-10-09 NOTE — Progress Notes (Signed)
SUBJECTIVE: Crystal Salazar is a 44 y.o. female who complains of urinary frequency, urgency and dysuria x 5 days, without flank pain, fever or abnormal vaginal discharge or bleeding.   OBJECTIVE: Appears well, in no apparent distress.  Vital signs are normal. Urine dipstick shows positive for protein, positive for leukocytes and positive for ketones.    ASSESSMENT: Dysuria  PLAN: Treatment per orders.  Call or return to clinic prn if these symptoms worsen or fail to improve as anticipated.

## 2020-10-14 ENCOUNTER — Telehealth: Payer: Self-pay

## 2020-10-14 ENCOUNTER — Other Ambulatory Visit: Payer: Self-pay | Admitting: Obstetrics

## 2020-10-14 LAB — URINE CULTURE

## 2020-10-14 NOTE — Telephone Encounter (Signed)
TC to pt to make aware of + UTI no answer and VM is full.

## 2020-10-14 NOTE — Telephone Encounter (Signed)
-----   Message from Brock Bad, MD sent at 10/14/2020  8:27 AM EDT ----- Macrobid Rx for UTI

## 2020-11-07 ENCOUNTER — Ambulatory Visit: Payer: BC Managed Care – PPO | Admitting: Obstetrics and Gynecology

## 2020-11-21 IMAGING — CR DG ANKLE COMPLETE 3+V*L*
4 series · 4 of 4 positions shown · non-contrast
Comparison: None.

CLINICAL DATA: Left ankle pain after twisting injury 3 days ago.

EXAM:
LEFT ANKLE COMPLETE - 3+ VIEW

[ankle ap]
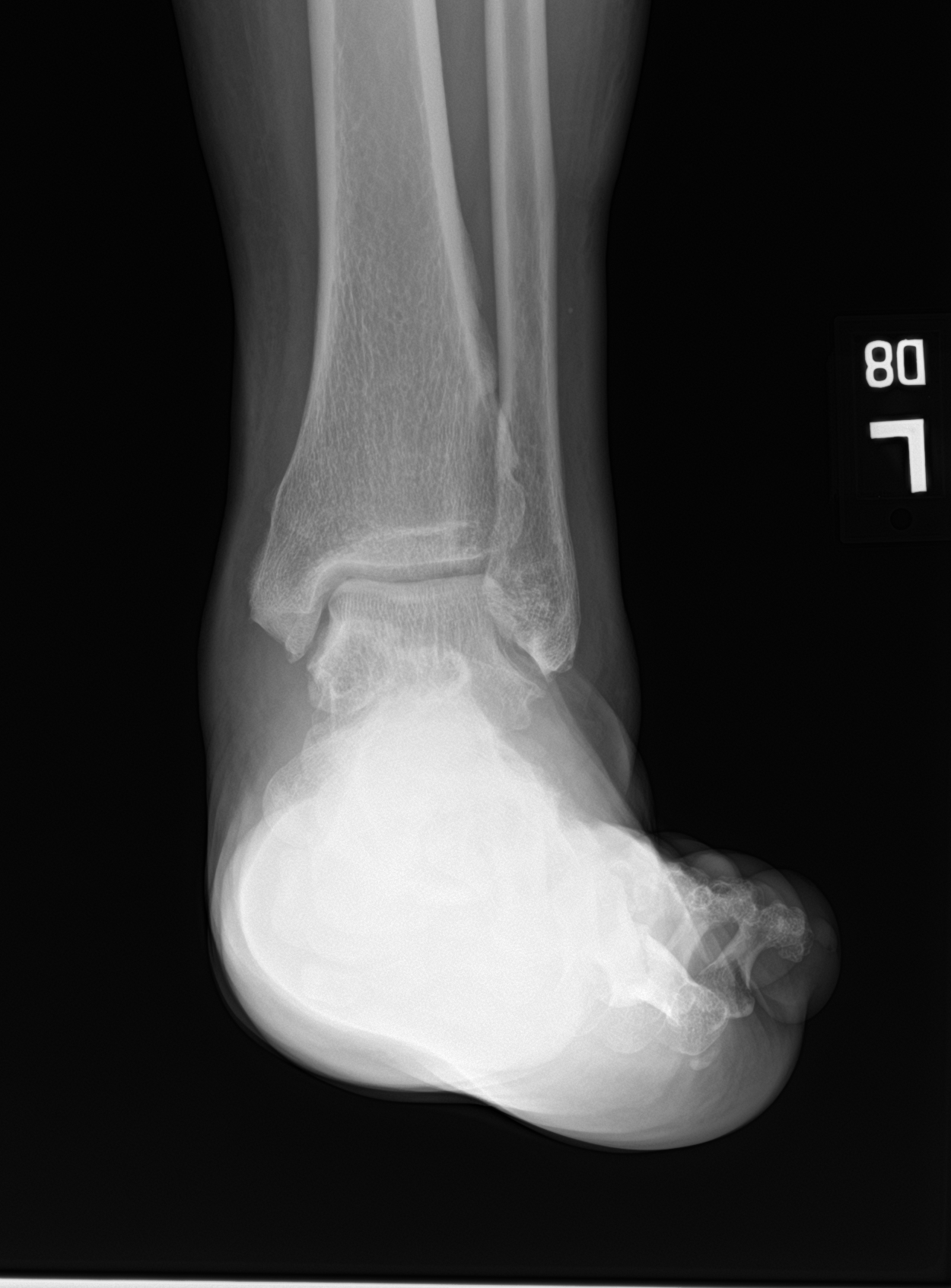

[ankle obl]
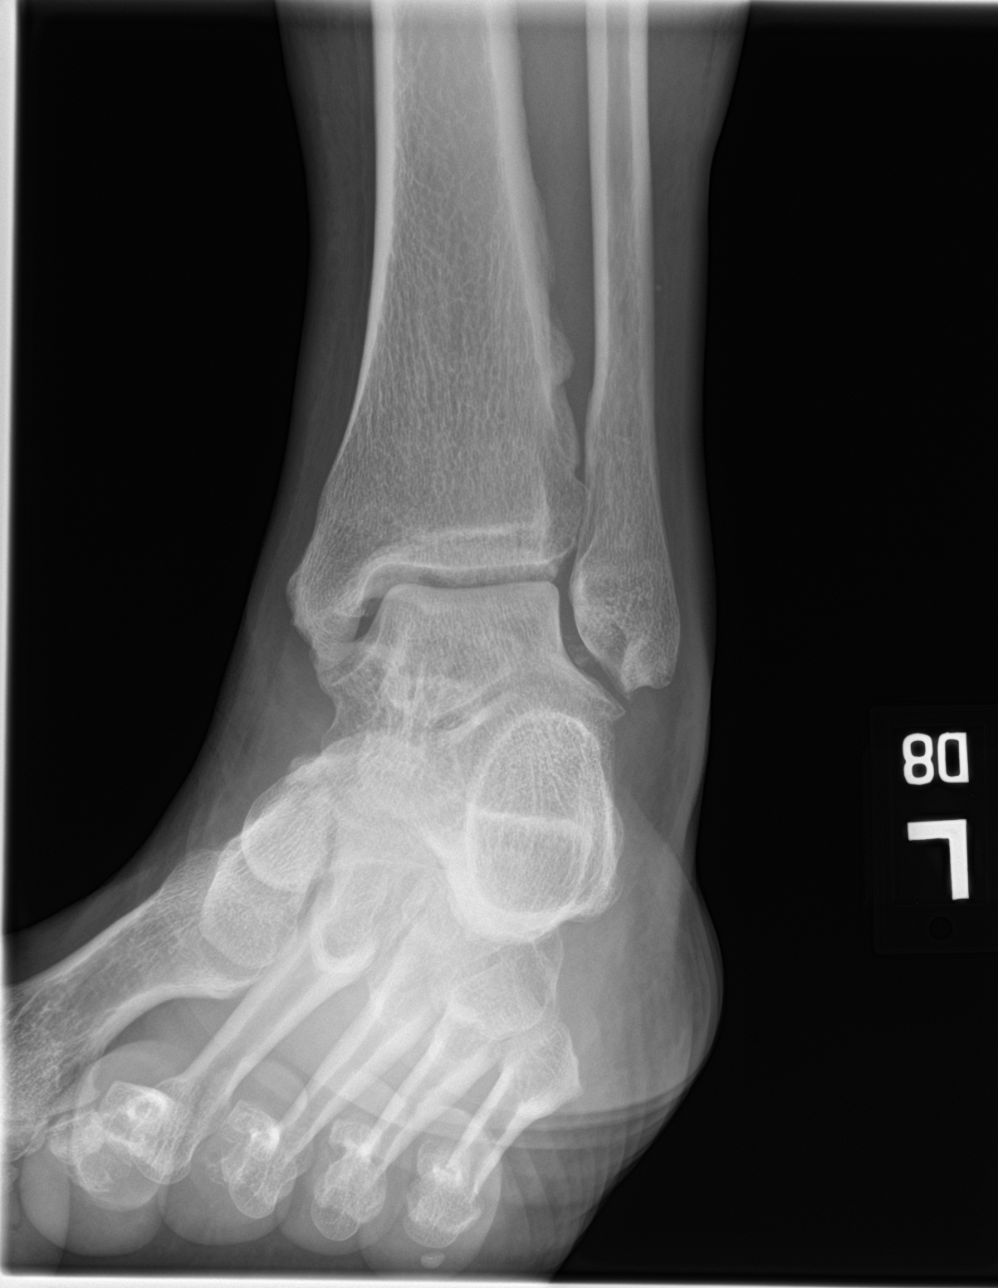

[ankle lat (1 of 2)]
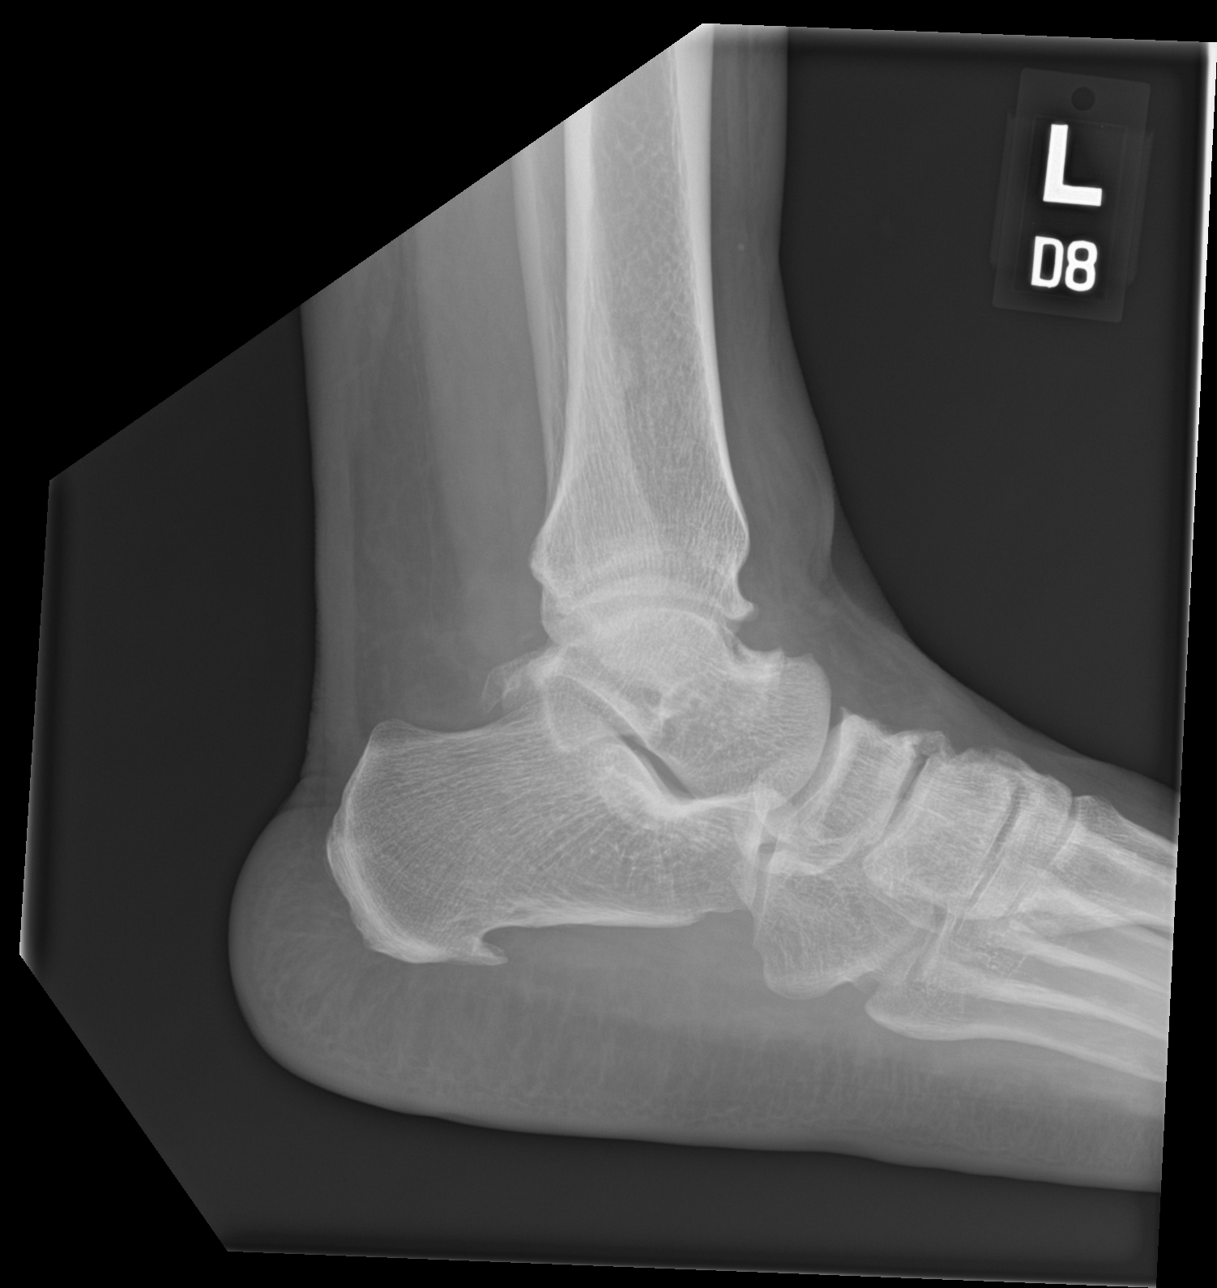

[ankle lat (2 of 2)]
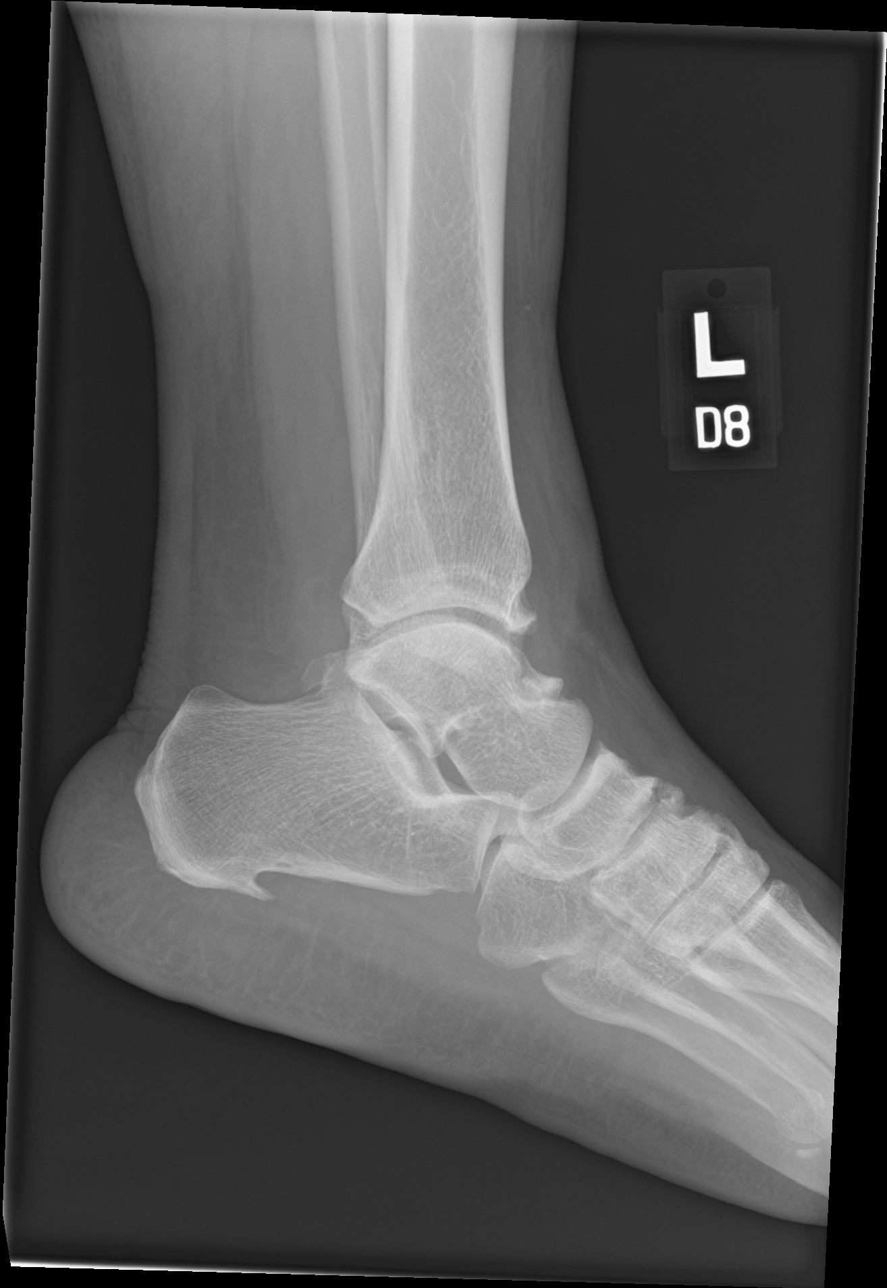

[4 of 4 positions shown; findings below may reference images not displayed]

FINDINGS: There is no evidence of fracture, dislocation, or joint effusion. No
significant joint space narrowing is noted. However, mild osteophyte
formation and spurring is seen involving the anterior portion of the
distal tibia suggesting mild degenerative changes. Soft tissues are
unremarkable.
IMPRESSION: Mild degenerative changes as described above. No acute abnormality
seen in the left ankle.

## 2020-12-04 ENCOUNTER — Ambulatory Visit (INDEPENDENT_AMBULATORY_CARE_PROVIDER_SITE_OTHER): Payer: BC Managed Care – PPO | Admitting: Obstetrics and Gynecology

## 2020-12-04 ENCOUNTER — Encounter: Payer: Self-pay | Admitting: Obstetrics and Gynecology

## 2020-12-04 ENCOUNTER — Encounter: Payer: Self-pay | Admitting: Obstetrics

## 2020-12-04 ENCOUNTER — Other Ambulatory Visit (HOSPITAL_COMMUNITY)
Admission: RE | Admit: 2020-12-04 | Discharge: 2020-12-04 | Disposition: A | Discharge status: Home / Self Care | Payer: BC Managed Care – PPO | Source: Ambulatory Visit | Attending: Obstetrics and Gynecology | Admitting: Obstetrics and Gynecology

## 2020-12-04 ENCOUNTER — Other Ambulatory Visit: Payer: Self-pay

## 2020-12-04 VITALS — BP 120/76 | HR 62 | Wt 191.0 lb

## 2020-12-04 DIAGNOSIS — B9689 Other specified bacterial agents as the cause of diseases classified elsewhere: Secondary | ICD-10-CM | POA: Diagnosis not present

## 2020-12-04 DIAGNOSIS — N760 Acute vaginitis: Secondary | ICD-10-CM | POA: Insufficient documentation

## 2020-12-04 DIAGNOSIS — Z01419 Encounter for gynecological examination (general) (routine) without abnormal findings: Secondary | ICD-10-CM | POA: Diagnosis not present

## 2020-12-04 NOTE — Progress Notes (Signed)
Pt presents for AEX and all STD labs  Mirena inserted 01/09/2019 Normal pap 09/08/2018

## 2020-12-04 NOTE — Progress Notes (Signed)
GYNECOLOGY ANNUAL PREVENTATIVE CARE ENCOUNTER NOTE  History:     Crystal Salazar is a 44 y.o. [phone] female here for a routine annual gynecologic exam.  Current complaints: none. Due for a mammogram. She had a death in the family and missed her mammo this year.    Denies abnormal vaginal bleeding, discharge, pelvic pain, problems with intercourse or other gynecologic concerns.   Identifies as female; she/her/hers  Gynecologic History Patient's last menstrual period was 11/04/2020. Contraception: IUD Last Pap: 2019 Results were: normal with negative HPV Last mammogram: 2018 Results were: normal  Obstetric History OB History  Gravida Para Term Preterm AB Living  6       2 4   SAB TAB Ectopic Multiple Live Births  1   1   4     # Outcome Date GA Lbr Len/2nd Weight Sex Delivery Anes PTL Lv  6 Gravida 03/14/08    M    LIV     Birth Comments: had a vanishing twin  5 Gravida 11/20/02    F    LIV  4 Ectopic 07/28/01          3 SAB 12/28/00          2 Gravida 06/25/96    F    LIV  1 Gravida 08/18/91    M    LIV    Past Medical History:  Diagnosis Date  . GERD (gastroesophageal reflux disease)     Past Surgical History:  Procedure Laterality Date  . ECTOPIC PREGNANCY SURGERY    . ESOPHAGEAL DILATION    . UPPER GASTROINTESTINAL ENDOSCOPY  2011   Dr 08/20/91    Current Outpatient Medications on File Prior to Visit  Medication Sig Dispense Refill  . levonorgestrel (MIRENA) 20 MCG/24HR IUD 1 each by Intrauterine route once.    . meloxicam (MOBIC) 15 MG tablet Take 15mg  (1 tab) Daily for 10 day, then take as needed (Patient not taking: Reported on 12/04/2020) 15 tablet 0   No current facility-administered medications on file prior to visit.    Allergies  Allergen Reactions  . Sulfonamide Derivatives     REACTION: Hives    Social History:  reports that she quit smoking about 5 years ago. Her smoking use included cigarettes. She has never used smokeless tobacco. She reports  current alcohol use. She reports that she does not use drugs.  Family History  Problem Relation Age of Onset  . Hypertension Mother   . Diabetes Mellitus I Father   . Diabetes Father   . Hypertension Father   . Cancer Paternal Grandmother   . Cancer Paternal Grandfather   . Breast cancer Maternal Aunt   . Colon cancer Neg Hx   . Colon polyps Neg Hx   . Esophageal cancer Neg Hx   . Rectal cancer Neg Hx   . Stomach cancer Neg Hx     The following portions of the patient's history were reviewed and updated as appropriate: allergies, current medications, past family history, past medical history, past social history, past surgical history and problem list.  Review of Systems Pertinent items noted in HPI and remainder of comprehensive ROS otherwise negative.  Physical Exam:  BP 120/76   Pulse 62   Wt 191 lb (86.6 kg)   LMP 11/04/2020   BMI 34.93 kg/m  CONSTITUTIONAL: Well-developed, well-nourished female in no acute distress.  HENT:  Normocephalic, atraumatic, External right and left ear normal.  EYES: Conjunctivae and EOM are normal. Pupils are  equal, round, and reactive to light. No scleral icterus.  NECK: Normal range of motion, supple, no masses.  Normal thyroid.  SKIN: Skin is warm and dry. No rash noted. Not diaphoretic. No erythema. No pallor. MUSCULOSKELETAL: Normal range of motion. No tenderness.  No cyanosis, clubbing, or edema.   NEUROLOGIC: Alert and oriented to person, place, and time. Normal reflexes, muscle tone coordination.  PSYCHIATRIC: Normal mood and affect. Normal behavior. Normal judgment and thought content. CARDIOVASCULAR: Normal heart rate noted, regular rhythm RESPIRATORY: Clear to auscultation bilaterally. Effort and breath sounds normal, no problems with respiration noted. BREASTS: Symmetric in size. No masses, tenderness, skin changes, nipple drainage, or lymphadenopathy bilaterally. Performed in the presence of a chaperone. ABDOMEN: Soft, no  distention noted.  No tenderness, rebound or guarding.  PELVIC: Normal appearing external genitalia and urethral meatus; normal appearing vaginal mucosa and cervix.  No abnormal discharge noted.  Pap smear obtained.  Normal uterine size, no other palpable masses, no uterine or adnexal tenderness.  Performed in the presence of a chaperone.   Assessment and Plan:   1. Women's annual routine gynecological examination  - Cytology - PAP( [facility]) - Cervicovaginal ancillary only( [place]) - RPR+HBsAg+HCVAb+... - HIV antibody (with reflex) - MM DIGITAL SCREENING BILATERAL; Future - TSH - IUD in place.    Will follow up results of pap smear and manage accordingly. Mammogram scheduled Routine preventative health maintenance measures emphasized. Please refer to After Visit Summary for other counseling recommendations.     Meaghan Whistler, Harolyn Rutherford, NP [facility] [employer], [facility]

## 2020-12-05 LAB — CERVICOVAGINAL ANCILLARY ONLY
Bacterial Vaginitis (gardnerella): POSITIVE — AB
Candida Glabrata: NEGATIVE
Candida Vaginitis: NEGATIVE
Chlamydia: NEGATIVE
Comment: NEGATIVE
Comment: NEGATIVE
Comment: NEGATIVE
Comment: NEGATIVE
Comment: NEGATIVE
Comment: NORMAL
Neisseria Gonorrhea: NEGATIVE
Trichomonas: NEGATIVE

## 2020-12-05 LAB — RPR+HBSAG+HCVAB+...
HIV Screen 4th Generation wRfx: NONREACTIVE
Hep C Virus Ab: <0.1 s/co ratio (ref 0.0–0.9)
Hepatitis B Surface Ag: NEGATIVE
RPR Ser Ql: NONREACTIVE

## 2020-12-05 LAB — TSH: TSH: 0.832 u[IU]/mL (ref 0.450–4.500)

## 2020-12-06 ENCOUNTER — Telehealth: Payer: Self-pay | Admitting: Obstetrics and Gynecology

## 2020-12-06 LAB — CYTOLOGY - PAP
Comment: NEGATIVE
Diagnosis: NEGATIVE
High risk HPV: NEGATIVE

## 2020-12-06 MED ORDER — METRONIDAZOLE 500 MG PO TABS: 500.0000 mg | ORAL_TABLET | Freq: Two times a day (BID) | ORAL | 0 refills | Status: AC

## 2020-12-06 NOTE — Telephone Encounter (Signed)
+   BV on wet prep Rx: Flagyl, no alcohol.    Venia Carbon I, NP 12/06/2020 10:04 AM

## 2021-02-27 ENCOUNTER — Other Ambulatory Visit: Payer: Self-pay

## 2021-02-27 ENCOUNTER — Ambulatory Visit (INDEPENDENT_AMBULATORY_CARE_PROVIDER_SITE_OTHER): Payer: BC Managed Care – PPO

## 2021-02-27 ENCOUNTER — Other Ambulatory Visit: Payer: Self-pay | Admitting: Obstetrics and Gynecology

## 2021-02-27 ENCOUNTER — Other Ambulatory Visit (HOSPITAL_COMMUNITY)
Admission: RE | Admit: 2021-02-27 | Discharge: 2021-02-27 | Disposition: A | Discharge status: Home / Self Care | Payer: BC Managed Care – PPO | Source: Ambulatory Visit

## 2021-02-27 VITALS — BP 104/61 | HR 91 | Ht 62.0 in | Wt 191.0 lb

## 2021-02-27 DIAGNOSIS — T8332XS Displacement of intrauterine contraceptive device, sequela: Secondary | ICD-10-CM

## 2021-02-27 DIAGNOSIS — B9689 Other specified bacterial agents as the cause of diseases classified elsewhere: Secondary | ICD-10-CM | POA: Insufficient documentation

## 2021-02-27 DIAGNOSIS — N390 Urinary tract infection, site not specified: Secondary | ICD-10-CM

## 2021-02-27 DIAGNOSIS — R350 Frequency of micturition: Secondary | ICD-10-CM

## 2021-02-27 DIAGNOSIS — Z01419 Encounter for gynecological examination (general) (routine) without abnormal findings: Secondary | ICD-10-CM

## 2021-02-27 DIAGNOSIS — Z1231 Encounter for screening mammogram for malignant neoplasm of breast: Secondary | ICD-10-CM

## 2021-02-27 DIAGNOSIS — N760 Acute vaginitis: Secondary | ICD-10-CM | POA: Diagnosis not present

## 2021-02-27 DIAGNOSIS — R102 Pelvic and perineal pain: Secondary | ICD-10-CM

## 2021-02-27 DIAGNOSIS — B9620 Unspecified Escherichia coli [E. coli] as the cause of diseases classified elsewhere: Secondary | ICD-10-CM

## 2021-02-27 DIAGNOSIS — Z113 Encounter for screening for infections with a predominantly sexual mode of transmission: Secondary | ICD-10-CM | POA: Insufficient documentation

## 2021-02-27 LAB — POCT URINALYSIS DIPSTICK
Bilirubin, UA: NEGATIVE
Glucose, UA: NEGATIVE
Ketones, UA: NEGATIVE
Nitrite, UA: NEGATIVE
Protein, UA: NEGATIVE
Spec Grav, UA: >=1.03 — AB (ref 1.010–1.025)
Urobilinogen, UA: 0.2 E.U./dL
pH, UA: 6 (ref 5.0–8.0)

## 2021-02-27 NOTE — Patient Instructions (Signed)
Pelvic Pain, Female Pelvic pain is pain in your lower abdomen, below your belly button and between your hips. The pain may start suddenly (be acute), keep coming back (be recurring), or last a long time (become chronic). Pelvic pain that lasts longer than 6 months is considered chronic. Pelvic pain may affect your:  Reproductive organs.  Urinary system.  Digestive tract.  Musculoskeletal system. There are many potential causes of pelvic pain. Sometimes, the pain can be a result of digestive or urinary conditions, strained muscles or ligaments, or reproductive conditions. Sometimes the cause of pelvic pain is not known. Follow these instructions at home:  Take over-the-counter and prescription medicines only as told by your health care provider.  Rest as told by your health care provider.  Do not have sex if it hurts.  Keep a journal of your pelvic pain. Write down: ? When the pain started. ? Where the pain is located. ? What seems to make the pain better or worse, such as food or your period (menstrual cycle). ? Any symptoms you have along with the pain.  Keep all follow-up visits as told by your health care provider. This is important.   Contact a health care provider if:  Medicine does not help your pain.  Your pain comes back.  You have new symptoms.  You have abnormal vaginal discharge or bleeding, including bleeding after menopause.  You have a fever or chills.  You are constipated.  You have blood in your urine or stool.  You have foul-smelling urine.  You feel weak or light-headed. Get help right away if:  You have sudden severe pain.  Your pain gets steadily worse.  You have severe pain along with fever, nausea, vomiting, or excessive sweating.  You lose consciousness. Summary  Pelvic pain is pain in your lower abdomen, below your belly button and between your hips.  There are many potential causes of pelvic pain.  Keep a journal of your pelvic  pain. This information is not intended to replace advice given to you by your health care provider. Make sure you discuss any questions you have with your health care provider. Document Revised: 06/01/2018 Document Reviewed: 06/01/2018 Elsevier Patient Education  2021 Elsevier Inc.  

## 2021-02-27 NOTE — Progress Notes (Signed)
GYNECOLOGY PROBLEM OFFICE VISIT NOTE  History:  Crystal Salazar is a 45 y.o. [id number] here today for pelvic pain.  She reports the pain started about 5 days ago and has been intermittent. She describes the pain as "just hurting." She states she has noted some blood, with wiping, but can not determine if it is vaginal or urethral. Patient endorses some urinary frequency and hesistancy.  She states after urination she has pain in the pelvic and abdominal area. Patient reports no pain with sexual activity and some relief with advil usage.  Patient with Mirena IUD in place, but does not check the strings.  Patient states she feels that her symptoms are c/w an UTI which she reports she has had several in the past.   She denies any abnormal vaginal discharge or other concerns.   Past Medical History:  Diagnosis Date  . GERD (gastroesophageal reflux disease)     Past Surgical History:  Procedure Laterality Date  . ECTOPIC PREGNANCY SURGERY    . ESOPHAGEAL DILATION    . UPPER GASTROINTESTINAL ENDOSCOPY  2011   Dr Arlyce Dice    The following portions of the patient's history were reviewed and updated as appropriate: allergies, current medications, past family history, past medical history, past social history, past surgical history and problem list.   Health Maintenance:  Normal pap and negative HRHPV in Dec 2021.  Normal mammogram in June 2018.   Review of Systems:  Genito-Urinary ROS: positive for - hematuria, pelvic pain and urinary frequency/urgency Gastrointestinal ROS: positive for - abdominal pain Objective:  Vitals: BP 104/61 (BP Location: Right Arm, Patient Position: Sitting, Cuff Size: Large)   Pulse 91   Ht 5\' 2"  (1.575 m)   Wt 191 lb (86.6 kg)   LMP 02/03/2021 (Approximate)   BMI 34.93 kg/m   Physical Exam: Physical Exam Constitutional:      General: She is not in acute distress.    Appearance: Normal appearance. She is not ill-appearing.  Genitourinary:     Right Labia: No  tenderness or lesions.    Left Labia: No tenderness or lesions.       Vaginal discharge present.     No vaginal bleeding.     Vaginal exam comments: Small amt thin white discharge.  No apparent odor.  CV collected..     No cervical motion tenderness, discharge, friability, polyp or nabothian cyst.     No IUD strings visualized.  HENT:     Head: Atraumatic.  Eyes:     Conjunctiva/sclera: Conjunctivae normal.  Cardiovascular:     Rate and Rhythm: Normal rate and regular rhythm.  Pulmonary:     Effort: Pulmonary effort is normal. No respiratory distress.     Breath sounds: Normal breath sounds.  Abdominal:     General: Bowel sounds are normal.  Neurological:     Mental Status: She is alert and oriented to person, place, and time.  Skin:    General: Skin is warm and dry.  Psychiatric:        Mood and Affect: Mood normal.        Behavior: Behavior normal.        Thought Content: Thought content normal.  Vitals reviewed. Exam conducted with a chaperone present.      Labs and Imaging: No results found.  Urine dipstick shows positive for RBC's and positive for leukocytes.  Micro exam: Not performed  Assessment & Plan:  45 year old Pelvic Pain IUD Strings Not Visualized Urinary Frequency  -  Reviewed UA results. Will send urine for culture and treat as appropriate -Informed of IUD strings that were not visualized.  Patient states she was not aware.  Informed and shown March 2020 [place] that was performed after placement f/u with no strings.  -Discussed sending for pelvic US to determine IUD placement if urine culture returns negative and/or if pelvic pain continues. -Patient verbalizes understanding and requests referral for mammogram. -Informed that previous order is still valid.  Message sent to front office staff for scheduling. -Results pending. -RTO PRN  Total face-to-face time with patient: 18 minutes   Gerrit Heck, CNM 02/27/2021 4:00 PM

## 2021-02-27 NOTE — Progress Notes (Signed)
Pt presents today for evaluation of pelvic pain. States this has been going on for approximately 5 days. Pt states she has noticed blood when urinating for about the same amount of time. Also has lower back pain and pressure with urination. Pt denies any vaginal d/c. Pt denies issues with constipation. Pt currently has Mirena IUD that was inserted of 12/2018. Pt states she has a very light cycle with her IUD. LMP: 02/03/2021.

## 2021-02-28 LAB — CERVICOVAGINAL ANCILLARY ONLY
Bacterial Vaginitis (gardnerella): POSITIVE — AB
Candida Glabrata: NEGATIVE
Candida Vaginitis: NEGATIVE
Chlamydia: NEGATIVE
Comment: NEGATIVE
Comment: NEGATIVE
Comment: NEGATIVE
Comment: NEGATIVE
Comment: NEGATIVE
Comment: NORMAL
Neisseria Gonorrhea: NEGATIVE
Trichomonas: NEGATIVE

## 2021-02-28 MED ORDER — METRONIDAZOLE 500 MG PO TABS: 500.0000 mg | ORAL_TABLET | Freq: Two times a day (BID) | ORAL | 0 refills | Status: AC

## 2021-02-28 NOTE — Addendum Note (Signed)
Addended by: Cherre Robins on: 02/28/2021 06:47 PM   Modules accepted: Orders

## 2021-03-02 LAB — URINE CULTURE

## 2021-03-03 MED ORDER — CEFADROXIL 500 MG PO CAPS: 500.0000 mg | ORAL_CAPSULE | Freq: Two times a day (BID) | ORAL | 0 refills | Status: DC

## 2021-03-03 NOTE — Addendum Note (Signed)
Addended by: Cherre Robins on: 03/03/2021 08:48 PM   Modules accepted: Orders

## 2021-03-04 ENCOUNTER — Other Ambulatory Visit: Payer: Self-pay

## 2021-03-04 DIAGNOSIS — N898 Other specified noninflammatory disorders of vagina: Secondary | ICD-10-CM

## 2021-03-04 MED ORDER — FLUCONAZOLE 150 MG PO TABS: 150.0000 mg | ORAL_TABLET | Freq: Once | ORAL | 1 refills | Status: AC

## 2021-03-04 NOTE — Progress Notes (Signed)
Rx sent for yeast infection ok per protocol since pt has been prescribed 2 antibiotics.

## 2021-04-24 ENCOUNTER — Ambulatory Visit: Payer: BC Managed Care – PPO

## 2021-05-22 ENCOUNTER — Ambulatory Visit
Admission: RE | Admit: 2021-05-22 | Discharge: 2021-05-22 | Disposition: A | Discharge status: Home / Self Care | Payer: BC Managed Care – PPO | Source: Ambulatory Visit | Attending: Obstetrics and Gynecology | Admitting: Obstetrics and Gynecology

## 2021-05-22 ENCOUNTER — Other Ambulatory Visit: Payer: Self-pay

## 2021-05-22 DIAGNOSIS — Z1231 Encounter for screening mammogram for malignant neoplasm of breast: Secondary | ICD-10-CM

## 2021-06-12 ENCOUNTER — Encounter: Payer: Self-pay | Admitting: *Deleted

## 2021-07-01 ENCOUNTER — Encounter: Payer: Self-pay | Admitting: *Deleted

## 2021-07-02 ENCOUNTER — Ambulatory Visit (INDEPENDENT_AMBULATORY_CARE_PROVIDER_SITE_OTHER): Payer: BC Managed Care – PPO

## 2021-07-02 ENCOUNTER — Other Ambulatory Visit (HOSPITAL_COMMUNITY)
Admission: RE | Admit: 2021-07-02 | Discharge: 2021-07-02 | Disposition: A | Discharge status: Home / Self Care | Payer: BC Managed Care – PPO | Source: Ambulatory Visit | Attending: Obstetrics and Gynecology | Admitting: Obstetrics and Gynecology

## 2021-07-02 ENCOUNTER — Other Ambulatory Visit: Payer: Self-pay

## 2021-07-02 VITALS — BP 126/80 | HR 56 | Ht 62.0 in | Wt 202.0 lb

## 2021-07-02 DIAGNOSIS — N898 Other specified noninflammatory disorders of vagina: Secondary | ICD-10-CM

## 2021-07-02 DIAGNOSIS — R350 Frequency of micturition: Secondary | ICD-10-CM | POA: Diagnosis not present

## 2021-07-02 LAB — POCT URINALYSIS DIPSTICK
Bilirubin, UA: NEGATIVE
Glucose, UA: NEGATIVE
Ketones, UA: NEGATIVE
Nitrite, UA: NEGATIVE
Protein, UA: POSITIVE — AB
Spec Grav, UA: 1.02 (ref 1.010–1.025)
Urobilinogen, UA: 0.2 E.U./dL
pH, UA: 6 (ref 5.0–8.0)

## 2021-07-02 NOTE — Progress Notes (Signed)
SUBJECTIVE: Crystal Salazar is a 45 y.o. female who complains of urinary frequency, urgency x 7 days, with pelvic  pain 8/10, vaginal discharge. Denies fever, chills, bleeding.   OBJECTIVE: Appears well, in no apparent distress.  Vital signs are normal. Urine dipstick shows positive for RBC's, positive for protein, and positive for leukocytes.    ASSESSMENT: Dysuria  PLAN: Urine and vaginal swab Cultures sent to lab. Treatment per results/orders.  Call or return to clinic prn if these symptoms worsen or fail to improve as anticipated.

## 2021-07-02 NOTE — Progress Notes (Signed)
Patient was assessed and managed by nursing staff during this encounter. I have reviewed the chart and agree with the documentation and plan. I have also made any necessary editorial changes. Patient preference was to wait for results of UC rather than starting on treatment today per RN.  Marylen Ponto, NP 07/02/2021 11:49 AM

## 2021-07-04 ENCOUNTER — Other Ambulatory Visit: Payer: Self-pay

## 2021-07-04 LAB — CERVICOVAGINAL ANCILLARY ONLY
Bacterial Vaginitis (gardnerella): POSITIVE — AB
Chlamydia: NEGATIVE
Comment: NEGATIVE
Comment: NEGATIVE
Comment: NEGATIVE
Comment: NORMAL
Neisseria Gonorrhea: NEGATIVE
Trichomonas: NEGATIVE

## 2021-07-05 LAB — URINE CULTURE

## 2021-07-06 NOTE — Progress Notes (Signed)
Please call pt to alert to +urine cx and BV and treat for UTI and BV per protocol.  Thank you, Joni Reining

## 2021-07-07 ENCOUNTER — Telehealth: Payer: Self-pay

## 2021-07-07 MED ORDER — METRONIDAZOLE 500 MG PO TABS: 500.0000 mg | ORAL_TABLET | Freq: Two times a day (BID) | ORAL | 0 refills | Status: AC

## 2021-07-07 MED ORDER — CEFADROXIL 500 MG PO CAPS: 500.0000 mg | ORAL_CAPSULE | Freq: Two times a day (BID) | ORAL | 0 refills | Status: AC

## 2021-07-07 NOTE — Telephone Encounter (Signed)
-----   Message from Marylen Ponto, NP sent at 07/06/2021  9:47 PM EDT ----- Please call pt to alert to +urine cx and BV and treat for UTI and BV per protocol.  Thank you, Joni Reining

## 2021-07-07 NOTE — Telephone Encounter (Signed)
Patient has BV and UTI, will treat per protocol. Call patient - no answer.

## 2022-02-13 ENCOUNTER — Other Ambulatory Visit (HOSPITAL_COMMUNITY)
Admission: RE | Admit: 2022-02-13 | Discharge: 2022-02-13 | Disposition: A | Discharge status: Home / Self Care | Payer: Managed Care, Other (non HMO) | Source: Ambulatory Visit | Attending: Obstetrics | Admitting: Obstetrics

## 2022-02-13 ENCOUNTER — Encounter: Payer: Self-pay | Admitting: Obstetrics

## 2022-02-13 ENCOUNTER — Other Ambulatory Visit: Payer: Self-pay

## 2022-02-13 ENCOUNTER — Ambulatory Visit (INDEPENDENT_AMBULATORY_CARE_PROVIDER_SITE_OTHER): Payer: Managed Care, Other (non HMO) | Admitting: Obstetrics

## 2022-02-13 VITALS — BP 119/72 | HR 62 | Ht 62.0 in | Wt 188.0 lb

## 2022-02-13 DIAGNOSIS — M25562 Pain in left knee: Secondary | ICD-10-CM

## 2022-02-13 DIAGNOSIS — Z01419 Encounter for gynecological examination (general) (routine) without abnormal findings: Secondary | ICD-10-CM | POA: Insufficient documentation

## 2022-02-13 DIAGNOSIS — Z1239 Encounter for other screening for malignant neoplasm of breast: Secondary | ICD-10-CM

## 2022-02-13 DIAGNOSIS — N760 Acute vaginitis: Secondary | ICD-10-CM

## 2022-02-13 DIAGNOSIS — M79672 Pain in left foot: Secondary | ICD-10-CM

## 2022-02-13 DIAGNOSIS — N898 Other specified noninflammatory disorders of vagina: Secondary | ICD-10-CM | POA: Diagnosis not present

## 2022-02-13 DIAGNOSIS — B9689 Other specified bacterial agents as the cause of diseases classified elsewhere: Secondary | ICD-10-CM

## 2022-02-13 DIAGNOSIS — F419 Anxiety disorder, unspecified: Secondary | ICD-10-CM

## 2022-02-13 DIAGNOSIS — Z30432 Encounter for removal of intrauterine contraceptive device: Secondary | ICD-10-CM | POA: Diagnosis not present

## 2022-02-13 DIAGNOSIS — M79671 Pain in right foot: Secondary | ICD-10-CM | POA: Diagnosis not present

## 2022-02-13 DIAGNOSIS — M25561 Pain in right knee: Secondary | ICD-10-CM

## 2022-02-13 DIAGNOSIS — F32A Depression, unspecified: Secondary | ICD-10-CM

## 2022-02-13 MED ORDER — METRONIDAZOLE 500 MG PO TABS: 500.0000 mg | ORAL_TABLET | Freq: Two times a day (BID) | ORAL | 2 refills | Status: AC

## 2022-02-13 NOTE — Progress Notes (Signed)
Subjective:        Crystal Salazar is a 46 y.o. female here for a routine exam.  Current complaints: Vaginal discharge.  Knee and foot pain.  She is on her feet a lot on her job and has to wear heavy steel shoes on concrete floor.  Personal health questionnaire:  Is patient Ashkenazi Jewish, have a family history of breast and/or ovarian cancer: no Is there a family history of uterine cancer diagnosed at age < 72, gastrointestinal cancer, urinary tract cancer, family member who is a Field seismologist syndrome-associated carrier: no Is the patient overweight and hypertensive, family history of diabetes, personal history of gestational diabetes, preeclampsia or PCOS: no Is patient over 22, have PCOS,  family history of premature CHD under age 27, diabetes, smoke, have hypertension or peripheral artery disease:  no At any time, has a partner hit, kicked or otherwise hurt or frightened you?: no Over the past 2 weeks, have you felt down, depressed or hopeless?: yes Over the past 2 weeks, have you felt little interest or pleasure in doing things?:yes   Gynecologic History Patient's last menstrual period was 01/26/2022 (approximate). Contraception: IUD Last Pap: 12-04-2020. Results were: normal Last mammogram: May 2022. Results were: normal  Obstetric History OB History  Gravida Para Term Preterm AB Living  6       2 4   SAB IAB Ectopic Multiple Live Births  1   1   4     # Outcome Date GA Lbr Len/2nd Weight Sex Delivery Anes PTL Lv  6 Gravida 03/14/08    M    LIV     Birth Comments: had a vanishing twin  5 Gravida 11/20/02    F    LIV  4 Ectopic 07/28/01          3 SAB 12/28/00          2 Gravida 06/25/96    F    LIV  1 Gravida 08/18/91    M    LIV    Past Medical History:  Diagnosis Date   GERD (gastroesophageal reflux disease)     Past Surgical History:  Procedure Laterality Date   ECTOPIC PREGNANCY SURGERY     ESOPHAGEAL DILATION     UPPER GASTROINTESTINAL ENDOSCOPY  2011   Dr  Deatra Ina     Current Outpatient Medications:    levonorgestrel (MIRENA) 20 MCG/24HR IUD, 1 each by Intrauterine route once., Disp: , Rfl:    cefadroxil (DURICEF) 500 MG capsule, Take 1 capsule (500 mg total) by mouth 2 (two) times daily., Disp: 14 capsule, Rfl: 0   metroNIDAZOLE (FLAGYL) 500 MG tablet, Take 1 tablet (500 mg total) by mouth 2 (two) times daily., Disp: 14 tablet, Rfl: 0   metroNIDAZOLE (FLAGYL) 500 MG tablet, Take 1 tablet (500 mg total) by mouth 2 (two) times daily., Disp: 14 tablet, Rfl: 0 Allergies  Allergen Reactions   Sulfonamide Derivatives     REACTION: Hives    Social History   Tobacco Use   Smoking status: Former    Types: Cigarettes    Quit date: 07/05/2015    Years since quitting: 6.6   Smokeless tobacco: Never   Tobacco comments:    restarted 2020, quit 12/2019  Substance Use Topics   Alcohol use: Yes    Alcohol/week: 0.0 standard drinks    Comment: occ, less than weekly    Family History  Problem Relation Age of Onset   Cancer Paternal Grandfather    Cancer Paternal  Grandmother    Diabetes Mellitus I Father    Diabetes Father    Hypertension Father    Diabetes Mother    Hypertension Mother    Breast cancer Maternal Aunt    Colon cancer Neg Hx    Colon polyps Neg Hx    Esophageal cancer Neg Hx    Rectal cancer Neg Hx    Stomach cancer Neg Hx       Review of Systems  Constitutional: negative for fatigue and weight loss Respiratory: negative for cough and wheezing Cardiovascular: negative for chest pain, fatigue and palpitations Gastrointestinal: negative for abdominal pain and change in bowel habits Musculoskeletal:negative for myalgias Neurological: negative for gait problems and tremors Behavioral/Psych: negative for abusive relationship, depression Endocrine: negative for temperature intolerance    Genitourinary:negative for abnormal menstrual periods, genital lesions, hot flashes, sexual problems and vaginal  discharge Integument/breast: negative for breast lump, breast tenderness, nipple discharge and skin lesion(s)    Objective:       BP 119/72    Pulse 62    Ht 5\' 2"  (1.575 m)    Wt 188 lb (85.3 kg)    LMP 01/26/2022 (Approximate)    BMI 34.39 kg/m  General:   alert  Skin:   no rash or abnormalities  Lungs:   clear to auscultation bilaterally  Heart:   regular rate and rhythm, S1, S2 normal, no murmur, click, rub or gallop  Breasts:   normal without suspicious masses, skin or nipple changes or axillary nodes  Abdomen:  normal findings: no organomegaly, soft, non-tender and no hernia  Pelvis:  External genitalia: normal general appearance Urinary system: urethral meatus normal and bladder without fullness, nontender Vaginal: normal without tenderness, induration or masses Cervix: normal appearance Adnexa: normal bimanual exam Uterus: anteverted and non-tender, normal size   Lab Review Urine pregnancy test Labs reviewed yes Radiologic studies reviewed yes  I have spent a total of 20 minutes of face-to-face time, excluding clinical staff time, reviewing notes and preparing to see patient, ordering tests and/or medications, and counseling the patient.    Assessment:    1. Encounter for routine gynecological examination with Papanicolaou smear of cervix Rx: - Cytology - PAP  2. Vaginal discharge Rx: - Cervicovaginal ancillary only( [facility])  3. BV (bacterial vaginosis) Rx: - metroNIDAZOLE (FLAGYL) 500 MG tablet; Take 1 tablet (500 mg total) by mouth 2 (two) times daily.  Dispense: 14 tablet; Refill: 2  4. Encounter for IUD removal  5. Foot pain, bilateral Rx: - Ambulatory referral to Podiatry  6. Pain in both knees, unspecified chronicity Rx: - Ambulatory referral to Orthopedics  7. Anxiety and depression Rx: - Ambulatory referral to [place]  8. Screening breast examination Rx: - MM Digital Screening; Future     Plan:    Education  reviewed: calcium supplements, depression evaluation, low fat, low cholesterol diet, safe sex/STD prevention, self breast exams, and weight bearing exercise. Contraception: none. Mammogram ordered. Follow up in: 1 year.     Shelly Bombard, MD 02/13/2022 9:12 AM

## 2022-02-13 NOTE — Progress Notes (Signed)
Annual GYN Pap Reports increased vaginal discharge, no irritation or odor Wants IUD out, states has been on birth control for a long time Needs referral for knee and foot pain

## 2022-02-16 LAB — CERVICOVAGINAL ANCILLARY ONLY
Bacterial Vaginitis (gardnerella): POSITIVE — AB
Candida Glabrata: NEGATIVE
Candida Vaginitis: NEGATIVE
Chlamydia: NEGATIVE
Comment: NEGATIVE
Comment: NEGATIVE
Comment: NEGATIVE
Comment: NEGATIVE
Comment: NEGATIVE
Comment: NORMAL
Neisseria Gonorrhea: NEGATIVE
Trichomonas: NEGATIVE

## 2022-02-17 ENCOUNTER — Ambulatory Visit: Payer: Self-pay

## 2022-02-17 ENCOUNTER — Encounter: Payer: Self-pay | Admitting: *Deleted

## 2022-02-17 ENCOUNTER — Ambulatory Visit (INDEPENDENT_AMBULATORY_CARE_PROVIDER_SITE_OTHER): Payer: Managed Care, Other (non HMO)

## 2022-02-17 ENCOUNTER — Other Ambulatory Visit: Payer: Self-pay | Admitting: Obstetrics

## 2022-02-17 ENCOUNTER — Other Ambulatory Visit: Payer: Self-pay

## 2022-02-17 ENCOUNTER — Ambulatory Visit (INDEPENDENT_AMBULATORY_CARE_PROVIDER_SITE_OTHER): Payer: Managed Care, Other (non HMO) | Admitting: Orthopaedic Surgery

## 2022-02-17 ENCOUNTER — Encounter: Payer: Self-pay | Admitting: Orthopaedic Surgery

## 2022-02-17 DIAGNOSIS — M25561 Pain in right knee: Secondary | ICD-10-CM | POA: Diagnosis not present

## 2022-02-17 DIAGNOSIS — M25562 Pain in left knee: Secondary | ICD-10-CM

## 2022-02-17 DIAGNOSIS — G8929 Other chronic pain: Secondary | ICD-10-CM | POA: Diagnosis not present

## 2022-02-17 LAB — CYTOLOGY - PAP
Comment: NEGATIVE
Diagnosis: NEGATIVE
High risk HPV: NEGATIVE

## 2022-02-17 MED ORDER — DICLOFENAC SODIUM 75 MG PO TBEC: 75.0000 mg | DELAYED_RELEASE_TABLET | Freq: Two times a day (BID) | ORAL | 2 refills | Status: DC

## 2022-02-17 NOTE — Progress Notes (Signed)
TC to patient to notify of BV and RX. No answer. No VM available. MyChart message with information and education on BV sent.

## 2022-02-17 NOTE — Progress Notes (Signed)
Office Visit Note   Patient: Crystal Salazar           Date of Birth: Feb 18, 1976           MRN: [id number] Visit Date: 02/17/2022              Requested by: Brock Bad, MD [address] [place],  [place] [address] PCP: Gwenevere Abbot, MD   Assessment & Plan: Visit Diagnoses:  1. Chronic pain of both knees     Plan: Impression is bilateral knee osteoarthritis.  Radiographically this is still fairly mild.  We talked about mainly nonoperative treatments including weight loss, home exercise, Voltaren gel, medications, injections.  She would like to try home exercises and Voltaren pills and gel for now.  Follow-up as needed.  Follow-Up Instructions: No follow-ups on file.   Orders:  Orders Placed This Encounter  Procedures   XR KNEE 3 VIEW LEFT   XR KNEE 3 VIEW RIGHT   Meds ordered this encounter  Medications   diclofenac (VOLTAREN) 75 MG EC tablet    Sig: Take 1 tablet (75 mg total) by mouth 2 (two) times daily.    Dispense:  30 tablet    Refill:  2      Procedures: No procedures performed   Clinical Data: No additional findings.   Subjective: Chief Complaint  Patient presents with   Right Knee - Pain   Left Knee - Pain    HPI  Crystal Salazar returns today for ongoing bilateral knee pain.  We last saw her in 2021.  She reports start up pain and stiffness.  She feels popping both knees.  This affects her gait as well.  She is currently not taking any medications.  Denies any injuries.  Review of Systems  Constitutional: Negative.   HENT: Negative.    Eyes: Negative.   Respiratory: Negative.    Cardiovascular: Negative.   Endocrine: Negative.   Musculoskeletal: Negative.   Neurological: Negative.   Hematological: Negative.   Psychiatric/Behavioral: Negative.    All other systems reviewed and are negative.   Objective: Vital Signs: LMP 01/26/2022 (Approximate)   Physical Exam Vitals and nursing note reviewed.  Constitutional:       Appearance: She is well-developed.  Pulmonary:     Effort: Pulmonary effort is normal.  Skin:    General: Skin is warm.     Capillary Refill: Capillary refill takes less than 2 seconds.  Neurological:     Mental Status: She is alert and oriented to person, place, and time.  Psychiatric:        Behavior: Behavior normal.        Thought Content: Thought content normal.        Judgment: Judgment normal.    Ortho Exam  Examination of both knees show no joint effusion.  Minimal crepitus with range of motion.  Well-preserved range of motion.  Collaterals and cruciates are stable.  Specialty Comments:  No specialty comments available.  Imaging: No results found.   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   Achilles tendinitis of right lower extremity 03/03/2016   Past Medical History:  Diagnosis Date   GERD (gastroesophageal reflux disease)     Family History  Problem Relation Age of Onset   Cancer Paternal Grandfather    Cancer Paternal Grandmother    Diabetes Mellitus I Father    Diabetes Father    Hypertension Father    Hypertension Mother    Kidney failure Mother    Breast cancer Maternal Aunt    Colon cancer Neg Hx    Colon polyps Neg Hx    Esophageal cancer Neg Hx    Rectal cancer Neg Hx    Stomach cancer Neg Hx     Past Surgical History:  Procedure Laterality Date   ECTOPIC PREGNANCY SURGERY     ESOPHAGEAL DILATION     UPPER GASTROINTESTINAL ENDOSCOPY  2011   Dr Arlyce Dice   Social  History   Occupational History   Not on file  Tobacco Use   Smoking status: Former    Types: Cigarettes    Quit date: 07/05/2015    Years since quitting: 6.6   Smokeless tobacco: Never   Tobacco comments:    restarted 2020, quit 12/2019  Vaping Use   Vaping Use: Never used  Substance and Sexual Activity   Alcohol use: Yes    Alcohol/week: 0.0 standard drinks    Comment: occ, less than weekly   Drug use: No   Sexual activity: Yes    Birth control/protection: I.U.D.

## 2022-02-23 ENCOUNTER — Ambulatory Visit (INDEPENDENT_AMBULATORY_CARE_PROVIDER_SITE_OTHER): Payer: Managed Care, Other (non HMO)

## 2022-02-23 ENCOUNTER — Ambulatory Visit (INDEPENDENT_AMBULATORY_CARE_PROVIDER_SITE_OTHER): Payer: Managed Care, Other (non HMO) | Admitting: Podiatry

## 2022-02-23 ENCOUNTER — Encounter: Payer: Self-pay | Admitting: Podiatry

## 2022-02-23 ENCOUNTER — Other Ambulatory Visit: Payer: Self-pay

## 2022-02-23 DIAGNOSIS — M722 Plantar fascial fibromatosis: Secondary | ICD-10-CM

## 2022-02-23 DIAGNOSIS — M7751 Other enthesopathy of right foot: Secondary | ICD-10-CM

## 2022-02-23 DIAGNOSIS — M7750 Other enthesopathy of unspecified foot: Secondary | ICD-10-CM

## 2022-02-23 NOTE — Patient Instructions (Signed)

## 2022-02-24 ENCOUNTER — Ambulatory Visit: Payer: Managed Care, Other (non HMO) | Admitting: Orthopaedic Surgery

## 2022-02-24 MED ORDER — MELOXICAM 15 MG PO TABS: 15.0000 mg | ORAL_TABLET | Freq: Every day | ORAL | 3 refills | Status: AC

## 2022-02-24 NOTE — Progress Notes (Signed)
°  Subjective:  Patient ID: Crystal Salazar, female    DOB: 1976-07-22,  MRN: [id number]  Chief Complaint  Patient presents with   Foot Pain     np-R foot pain near arch/heel area x 6 months    46 y.o. female presents with the above complaint. History confirmed with patient.  She was still toed shoes for work and stands all day.  Works on English as a second language teacher.  Objective:  Physical Exam: warm, good capillary refill, no trophic changes or ulcerative lesions, normal DP and PT pulses, and normal sensory exam. Left Foot: normal exam, no swelling, tenderness, instability; ligaments intact, full range of motion of all ankle/foot joints Right Foot: point tenderness over the heel pad    Radiographs: Multiple views x-ray of the right foot: no fracture, dislocation, swelling or degenerative changes noted and plantar calcaneal spur Assessment:   1. Plantar fasciitis, right      Plan:  Patient was evaluated and treated and all questions answered.  Discussed the etiology and treatment options for plantar fasciitis including stretching, formal physical therapy, supportive shoegears such as a running shoe or sneaker, pre fabricated orthoses, injection therapy, and oral medications. We also discussed the role of surgical treatment of this for patients who do not improve after exhausting non-surgical treatment options.   -XR reviewed with patient -Educated patient on stretching and icing of the affected limb -Injection delivered to the plantar fascia of the right foot. -Rx meloxicam reviewed use and possible side effects  After sterile prep with povidone-iodine solution and alcohol, the right heel was injected with 0.5cc 2% xylocaine plain, 0.5cc 0.5% marcaine plain, 5mg  triamcinolone acetonide, and 2mg  dexamethasone was injected along the medial plantar fascia at the insertion on the plantar calcaneus. The patient tolerated the procedure well without complication.   Return in about 1 month (around  03/23/2022) for recheck plantar fasciitis.

## 2022-03-06 ENCOUNTER — Ambulatory Visit: Payer: Managed Care, Other (non HMO) | Admitting: Orthopaedic Surgery

## 2022-03-23 ENCOUNTER — Ambulatory Visit: Payer: Managed Care, Other (non HMO) | Admitting: Podiatry

## 2022-04-07 ENCOUNTER — Ambulatory Visit (INDEPENDENT_AMBULATORY_CARE_PROVIDER_SITE_OTHER): Payer: Medicaid Other | Admitting: Podiatry

## 2022-04-07 DIAGNOSIS — M722 Plantar fascial fibromatosis: Secondary | ICD-10-CM | POA: Diagnosis not present

## 2022-04-08 NOTE — Progress Notes (Signed)
?  Subjective:  ?Patient ID: Crystal Salazar, female    DOB: [birth year],  MRN: [id number] ? ?Chief Complaint  ?Patient presents with  ? Plantar Fasciitis  ?  Follow up right foot  ? ? ?46 y.o. female presents with the above complaint. History confirmed with patient.  She is doing very well having very little pain at this point ? ?Objective:  ?Physical Exam: ?warm, good capillary refill, no trophic changes or ulcerative lesions, normal DP and PT pulses, and normal sensory exam. ?Left Foot: normal exam, no swelling, tenderness, instability; ligaments intact, full range of motion of all ankle/foot joints ?Right Foot: No tenderness over the heel pad today ? ? ? ?Radiographs: ?Multiple views x-ray of the right foot: no fracture, dislocation, swelling or degenerative changes noted and plantar calcaneal spur ?Assessment:  ? ?1. Plantar fasciitis, right   ? ? ? ?Plan:  ?Patient was evaluated and treated and all questions answered. ? ?Overall doing well and seems to be near 100%.  I advised her to continue doing her home therapy exercise plan for at least 2 weeks until she is pain-free.  She will return to see me as needed. ? ? ?Return if symptoms worsen or fail to improve.  ? ?

## 2022-05-29 ENCOUNTER — Inpatient Hospital Stay: Admission: RE | Admit: 2022-05-29 | Payer: Managed Care, Other (non HMO) | Source: Ambulatory Visit

## 2022-09-21 ENCOUNTER — Encounter: Payer: Self-pay | Admitting: Obstetrics and Gynecology

## 2022-09-21 ENCOUNTER — Ambulatory Visit (INDEPENDENT_AMBULATORY_CARE_PROVIDER_SITE_OTHER): Payer: Medicaid Other | Admitting: Obstetrics and Gynecology

## 2022-09-21 VITALS — BP 116/73 | HR 75 | Ht 62.0 in | Wt 182.0 lb

## 2022-09-21 DIAGNOSIS — Z1231 Encounter for screening mammogram for malignant neoplasm of breast: Secondary | ICD-10-CM | POA: Diagnosis not present

## 2022-09-21 DIAGNOSIS — N926 Irregular menstruation, unspecified: Secondary | ICD-10-CM | POA: Diagnosis not present

## 2022-09-21 NOTE — Progress Notes (Signed)
46 yo P6 with LMP 08/17/22 and BMI 33 presenting today for the evaluation of a late period. Patient reports no intercourse in over a year. She reports a monthly period last 3-7 days but missed a period this month. She reports some occasional vasomotor symptoms. She denies pelvic pain or abnormal discharge  Past Medical History:  Diagnosis Date   GERD (gastroesophageal reflux disease)    Past Surgical History:  Procedure Laterality Date   ECTOPIC PREGNANCY SURGERY     ESOPHAGEAL DILATION     UPPER GASTROINTESTINAL ENDOSCOPY  2011   Dr Deatra Ina   Family History  Problem Relation Age of Onset   Cancer Paternal Grandfather    Cancer Paternal Grandmother    Diabetes Mellitus I Father    Diabetes Father    Hypertension Father    Hypertension Mother    Kidney failure Mother    Breast cancer Maternal Aunt    Colon cancer Neg Hx    Colon polyps Neg Hx    Esophageal cancer Neg Hx    Rectal cancer Neg Hx    Stomach cancer Neg Hx    Social History   Tobacco Use   Smoking status: Former    Types: Cigarettes    Quit date: 07/05/2015    Years since quitting: 7.2   Smokeless tobacco: Never   Tobacco comments:    restarted 2020, quit 12/2019  Vaping Use   Vaping Use: Never used  Substance Use Topics   Alcohol use: Yes    Alcohol/week: 0.0 standard drinks of alcohol    Comment: occ, less than weekly   Drug use: No   ROS See pertinent in HPI. Al other systems reviewed and non contributory Blood pressure 116/73, pulse 75, height 5\' 2"  (1.575 m), weight 182 lb (82.6 kg), last menstrual period 08/17/2022. GENERAL: Well-developed, well-nourished female in no acute distress.  NEURO: alert and oriented x 3  A/P 46 yo with late menses - Reassurance provided - Patient advised to keep a menstrual calendar - Reviewed menopause and associated symptoms - Advised patient to return if abnormal bleeding occurs - Screening mammogram ordered as patient missed her appointment

## 2022-09-21 NOTE — Progress Notes (Signed)
46 y.o presents for missed period LMP 08/17/22. C/o lower back pain, cramps 5/10  Last PAP 02/13/22

## 2022-10-08 ENCOUNTER — Ambulatory Visit
Admission: RE | Admit: 2022-10-08 | Discharge: 2022-10-08 | Disposition: A | Discharge status: Home / Self Care | Payer: Medicaid Other | Source: Ambulatory Visit | Attending: Obstetrics and Gynecology | Admitting: Obstetrics and Gynecology

## 2022-10-08 DIAGNOSIS — Z1231 Encounter for screening mammogram for malignant neoplasm of breast: Secondary | ICD-10-CM

## 2022-12-14 ENCOUNTER — Ambulatory Visit (INDEPENDENT_AMBULATORY_CARE_PROVIDER_SITE_OTHER): Payer: Medicaid Other | Admitting: Podiatry

## 2022-12-14 DIAGNOSIS — Z91199 Patient's noncompliance with other medical treatment and regimen due to unspecified reason: Secondary | ICD-10-CM

## 2022-12-14 NOTE — Progress Notes (Signed)
Patient was no-show for appointment today 

## 2023-01-07 ENCOUNTER — Ambulatory Visit (INDEPENDENT_AMBULATORY_CARE_PROVIDER_SITE_OTHER): Payer: Medicaid Other | Admitting: Podiatry

## 2023-01-07 DIAGNOSIS — L0889 Other specified local infections of the skin and subcutaneous tissue: Secondary | ICD-10-CM | POA: Diagnosis not present

## 2023-01-07 DIAGNOSIS — M109 Gout, unspecified: Secondary | ICD-10-CM

## 2023-01-07 NOTE — Progress Notes (Signed)
  Subjective:  Patient ID: Crystal Salazar, female    DOB: 28-Jan-1976,  MRN: [id number]  Chief Complaint  Patient presents with   Plantar Fasciitis    Patient came in today for a plantar fasciitis patient is now having ankle pain, rate of pain 6 out of 10,     47 y.o. female presents with the above complaint. History confirmed with patient.  Plantar fasciitis is doing much better, around the time just before Thanksgiving her ankle became quite swollen and tender, was red and hot doing better now.  Heel pain is doing much better  Objective:  Physical Exam: warm, good capillary refill, no trophic changes or ulcerative lesions, normal DP and PT pulses, normal sensory exam, and no pain on right heel today, the left ankle is not painful to range of motion no tenderness swelling edema or erythema today.  Assessment:   1. Acute gout of left ankle, unspecified cause      Plan:  Patient was evaluated and treated and all questions answered.  Discussed possible etiologies of pain she was having, suspect this was a likely gout flare she does have some family history of this.  Currently asymptomatic advised to watch and monitor this, if she has worsening pain swelling she will me know we will check her uric acid acutely, I gave her information on gouty arthropathy, uric acid and a low purine diet  Return if symptoms worsen or fail to improve.

## 2023-03-23 ENCOUNTER — Ambulatory Visit (INDEPENDENT_AMBULATORY_CARE_PROVIDER_SITE_OTHER): Payer: Medicaid Other | Admitting: Podiatry

## 2023-03-23 ENCOUNTER — Encounter: Payer: Self-pay | Admitting: Podiatry

## 2023-03-23 ENCOUNTER — Ambulatory Visit (INDEPENDENT_AMBULATORY_CARE_PROVIDER_SITE_OTHER): Payer: Medicaid Other

## 2023-03-23 DIAGNOSIS — S99922A Unspecified injury of left foot, initial encounter: Secondary | ICD-10-CM

## 2023-03-23 DIAGNOSIS — S92502A Displaced unspecified fracture of left lesser toe(s), initial encounter for closed fracture: Secondary | ICD-10-CM

## 2023-03-23 NOTE — Progress Notes (Signed)
  Subjective:  Patient ID: Crystal Salazar, female    DOB: [birth year],  MRN: [id number]  Chief Complaint  Patient presents with   Gout    left foot pain - jammed her fifth toe into a heavy piece of furniture    47 y.o. female presents with the above complaint. History confirmed with patient.  She presents today with a new issue, happened over the weekend.  Objective:  Physical Exam: warm, good capillary refill, no trophic changes or ulcerative lesions, normal DP and PT pulses, normal sensory exam, and pain tenderness and swelling over fifth toe left foot.   Radiographs: Multiple views x-ray of the left foot: Fracture fifth proximal phalanx spiral, minimal displacement Assessment:   1. Toe injury, left, initial encounter   2. Closed fracture of phalanx of left fifth toe, initial encounter      Plan:  Patient was evaluated and treated and all questions answered.  Reviewed her x-rays.  Discussed treatment of this.  I displayed buddy splint of the fourth and fifth toes together to her and she will do this with [place].  She is comfortable in regular shoe gear we discussed the option of a surgical shoe, but likely not necessary at this point if she is able to ambulate comfortably.  Return to see me as needed if it does not improve or worsens.  Return if symptoms worsen or fail to improve.

## 2023-11-01 ENCOUNTER — Ambulatory Visit: Payer: Medicaid Other | Admitting: Obstetrics and Gynecology

## 2023-11-01 ENCOUNTER — Encounter: Payer: Self-pay | Admitting: Obstetrics and Gynecology

## 2023-11-01 VITALS — BP 132/73 | HR 60 | Ht 62.0 in | Wt 215.8 lb

## 2023-11-01 DIAGNOSIS — Z01419 Encounter for gynecological examination (general) (routine) without abnormal findings: Secondary | ICD-10-CM | POA: Diagnosis not present

## 2023-11-01 NOTE — Progress Notes (Signed)
Subjective:     Crystal Salazar is a 47 y.o. female P4 with LMP 10/12/23 and BMI 39 who is here for a comprehensive physical exam. The patient reports irregular menses with last period in October after several months of amenorrhea. She is not sexually active. She denies pelvic pain or abnormal discharge. She reports some vasomotor symptoms. She is without any other complaints  Past Medical History:  Diagnosis Date   GERD (gastroesophageal reflux disease)    Past Surgical History:  Procedure Laterality Date   ECTOPIC PREGNANCY SURGERY     ESOPHAGEAL DILATION     UPPER GASTROINTESTINAL ENDOSCOPY  2011   Dr Arlyce Dice   Family History  Problem Relation Age of Onset   Cancer Paternal Grandfather    Cancer Paternal Grandmother    Diabetes Mellitus I Father    Diabetes Father    Hypertension Father    Hypertension Mother    Kidney failure Mother    Breast cancer Maternal Aunt    Colon cancer Neg Hx    Colon polyps Neg Hx    Esophageal cancer Neg Hx    Rectal cancer Neg Hx    Stomach cancer Neg Hx     Social History   Socioeconomic History   Marital status: Married    Spouse name: Not on file   Number of children: Not on file   Years of education: Not on file   Highest education level: Not on file  Occupational History   Not on file  Tobacco Use   Smoking status: Former    Current packs/day: 0.00    Types: Cigarettes    Quit date: 07/05/2015    Years since quitting: 8.3   Smokeless tobacco: Never   Tobacco comments:    restarted 2020, quit 12/2019  Vaping Use   Vaping status: Never Used  Substance and Sexual Activity   Alcohol use: Yes    Alcohol/week: 0.0 standard drinks of alcohol    Comment: occ, less than weekly   Drug use: No   Sexual activity: Yes    Birth control/protection: I.U.D.  Other Topics Concern   Not on file  Social History Narrative   Not on file   Social Determinants of Health   Financial Resource Strain: Not on file  Food Insecurity: Not on file   Transportation Needs: Not on file  Physical Activity: Not on file  Stress: Not on file  Social Connections: Unknown (05/08/2022)   Received from [facility]   Social Network    Social Network: Not on file  Intimate Partner Violence: Unknown (03/31/2022)   Received from [facility]   HITS    Physically Hurt: Not on file    Insult or Talk Down To: Not on file    Threaten Physical Harm: Not on file    Scream or Curse: Not on file   Health Maintenance  Topic Date Due   INFLUENZA VACCINE  07/29/2023   COVID-19 Vaccine (1 - 2023-24 season) Never done   Cervical Cancer Screening (HPV/Pap Cotest)  02/13/2027   DTaP/Tdap/Td (2 - Td or Tdap) 04/27/2028   Colonoscopy  03/06/2030   Hepatitis C Screening  Completed   HIV Screening  Completed   HPV VACCINES  Aged Out       Review of Systems Pertinent items noted in HPI and remainder of comprehensive ROS otherwise negative.   Objective:  Blood pressure 132/73, pulse 60, height 5\' 2"  (1.575 m), weight 215 lb 12.8 oz (  97.9 kg).   GENERAL: Well-developed, well-nourished female in no acute distress.  HEENT: Normocephalic, atraumatic. Sclerae anicteric.  NECK: Supple. Normal thyroid.  LUNGS: Clear to auscultation bilaterally.  HEART: Regular rate and rhythm. BREASTS: Symmetric in size. No palpable masses or lymphadenopathy, skin changes, or nipple drainage. ABDOMEN: Soft, nontender, nondistended. No organomegaly. PELVIC: Normal external female genitalia. Vagina is pink and rugated.  Normal discharge. Normal appearing cervix. Uterus is normal in size. No adnexal mass or tenderness. Chaperone present during the pelvic exam EXTREMITIES: No cyanosis, clubbing, or edema, 2+ distal pulses.     Assessment:    Healthy female exam.      Plan:    Pap smear not done per patient request- normal pap smear 2023 Screening mammogram ordered Health maintenance labs ordered along with [facility] and [facility] Patient will be  contacted with abnormal results See After Visit Summary for Counseling Recommendations

## 2023-11-02 LAB — CBC
Hematocrit: 38.8 % (ref 34.0–46.6)
Hemoglobin: 12.7 g/dL (ref 11.1–15.9)
MCH: 31.8 pg (ref 26.6–33.0)
MCHC: 32.7 g/dL (ref 31.5–35.7)
MCV: 97 fL (ref 79–97)
Platelets: 306 10*3/uL (ref 150–450)
RBC: 3.99 x10E6/uL (ref 3.77–5.28)
RDW: 11.8 % (ref 11.7–15.4)
WBC: 5.9 10*3/uL (ref 3.4–10.8)

## 2023-11-02 LAB — COMPREHENSIVE METABOLIC PANEL
ALT: 11 [IU]/L (ref 0–32)
AST: 19 [IU]/L (ref 0–40)
Albumin: 4.3 g/dL (ref 3.9–4.9)
Alkaline Phosphatase: 70 [IU]/L (ref 44–121)
BUN/Creatinine Ratio: 13 (ref 9–23)
BUN: 11 mg/dL (ref 6–24)
Bilirubin Total: 0.6 mg/dL (ref 0.0–1.2)
CO2: 23 mmol/L (ref 20–29)
Calcium: 9.1 mg/dL (ref 8.7–10.2)
Chloride: 102 mmol/L (ref 96–106)
Creatinine, Ser: 0.83 mg/dL (ref 0.57–1.00)
Globulin, Total: 2.8 g/dL (ref 1.5–4.5)
Glucose: 79 mg/dL (ref 70–99)
Potassium: 3.7 mmol/L (ref 3.5–5.2)
Sodium: 140 mmol/L (ref 134–144)
Total Protein: 7.1 g/dL (ref 6.0–8.5)
eGFR: 86 mL/min/{1.73_m2} (ref >59)

## 2023-11-02 LAB — HEMOGLOBIN A1C
Est. average glucose Bld gHb Est-mCnc: 114 mg/dL
Hgb A1c MFr Bld: 5.6 % (ref 4.8–5.6)

## 2023-11-02 LAB — LIPID PANEL
Chol/HDL Ratio: 2.7 ratio (ref 0.0–4.4)
Cholesterol, Total: 172 mg/dL (ref 100–199)
HDL: 64 mg/dL (ref >39)
LDL Chol Calc (NIH): 92 mg/dL (ref 0–99)
Triglycerides: 87 mg/dL (ref 0–149)
VLDL Cholesterol Cal: 16 mg/dL (ref 5–40)

## 2023-11-02 LAB — LUTEINIZING HORMONE: LH: 9.2 m[IU]/mL

## 2023-11-02 LAB — TSH: TSH: 1.35 u[IU]/mL (ref 0.450–4.500)

## 2023-11-02 LAB — FOLLICLE STIMULATING HORMONE: FSH: 13.3 m[IU]/mL

## 2023-11-30 ENCOUNTER — Ambulatory Visit: Payer: Medicaid Other

## 2023-12-23 ENCOUNTER — Ambulatory Visit
Admission: RE | Admit: 2023-12-23 | Discharge: 2023-12-23 | Disposition: A | Discharge status: Home / Self Care | Payer: Medicaid Other | Source: Ambulatory Visit | Attending: Obstetrics and Gynecology | Admitting: Obstetrics and Gynecology

## 2023-12-23 DIAGNOSIS — Z1231 Encounter for screening mammogram for malignant neoplasm of breast: Secondary | ICD-10-CM | POA: Diagnosis not present

## 2023-12-23 DIAGNOSIS — Z01419 Encounter for gynecological examination (general) (routine) without abnormal findings: Secondary | ICD-10-CM

## 2024-06-13 ENCOUNTER — Encounter: Payer: Self-pay | Admitting: Obstetrics and Gynecology

## 2024-07-17 ENCOUNTER — Ambulatory Visit

## 2024-07-17 ENCOUNTER — Other Ambulatory Visit: Payer: Self-pay | Admitting: Obstetrics and Gynecology

## 2024-07-17 ENCOUNTER — Ambulatory Visit: Payer: Self-pay | Admitting: Obstetrics and Gynecology

## 2024-07-17 ENCOUNTER — Ambulatory Visit: Admitting: Obstetrics and Gynecology

## 2024-07-17 DIAGNOSIS — N898 Other specified noninflammatory disorders of vagina: Secondary | ICD-10-CM | POA: Diagnosis not present

## 2024-07-17 LAB — POCT URINALYSIS DIPSTICK
Bilirubin, UA: NEGATIVE
Glucose, UA: NEGATIVE
Ketones, UA: NEGATIVE
Nitrite, UA: POSITIVE
Protein, UA: POSITIVE — AB
Spec Grav, UA: 1.015 (ref 1.010–1.025)
Urobilinogen, UA: 0.2 U/dL
pH, UA: 6.5 (ref 5.0–8.0)

## 2024-07-17 MED ORDER — CEFADROXIL 500 MG PO CAPS: 500.0000 mg | ORAL_CAPSULE | Freq: Two times a day (BID) | ORAL | 0 refills | Status: AC

## 2024-07-17 MED ORDER — NITROFURANTOIN MONOHYD MACRO 100 MG PO CAPS: 100.0000 mg | ORAL_CAPSULE | Freq: Two times a day (BID) | ORAL | 0 refills | Status: AC

## 2024-07-20 LAB — URINE CULTURE

## 2024-11-15 DIAGNOSIS — R42 Dizziness and giddiness: Secondary | ICD-10-CM | POA: Diagnosis not present

## 2024-11-16 DIAGNOSIS — R079 Chest pain, unspecified: Secondary | ICD-10-CM | POA: Diagnosis not present
